# Patient Record
Sex: Female | Born: 1970 | Race: Black or African American | Hispanic: No | Smoking: Never smoker
Health system: Southern US, Community
[De-identification: ages and names within clinical notes are randomized; demographics above are authoritative.]

## PROBLEM LIST (undated history)

## (undated) DIAGNOSIS — B009 Herpesviral infection, unspecified: Secondary | ICD-10-CM

## (undated) DIAGNOSIS — L732 Hidradenitis suppurativa: Secondary | ICD-10-CM

## (undated) DIAGNOSIS — J45909 Unspecified asthma, uncomplicated: Secondary | ICD-10-CM

## (undated) DIAGNOSIS — D649 Anemia, unspecified: Secondary | ICD-10-CM

## (undated) DIAGNOSIS — I10 Essential (primary) hypertension: Secondary | ICD-10-CM

## (undated) DIAGNOSIS — E1165 Type 2 diabetes mellitus with hyperglycemia: Secondary | ICD-10-CM

## (undated) DIAGNOSIS — H269 Unspecified cataract: Secondary | ICD-10-CM

## (undated) DIAGNOSIS — E11319 Type 2 diabetes mellitus with unspecified diabetic retinopathy without macular edema: Secondary | ICD-10-CM

## (undated) DIAGNOSIS — H409 Unspecified glaucoma: Secondary | ICD-10-CM

## (undated) DIAGNOSIS — IMO0002 Reserved for concepts with insufficient information to code with codable children: Secondary | ICD-10-CM

## (undated) DIAGNOSIS — L709 Acne, unspecified: Secondary | ICD-10-CM

## (undated) HISTORY — PX: BREAST CYST EXCISION: SHX579

## (undated) HISTORY — DX: Acne, unspecified: L70.9

## (undated) HISTORY — PX: EYE SURGERY: SHX253

## (undated) HISTORY — DX: Unspecified glaucoma: H40.9

## (undated) HISTORY — DX: Unspecified cataract: H26.9

## (undated) HISTORY — DX: Type 2 diabetes mellitus with unspecified diabetic retinopathy without macular edema: E11.319

## (undated) HISTORY — DX: Hidradenitis suppurativa: L73.2

## (undated) HISTORY — DX: Type 2 diabetes mellitus with hyperglycemia: E11.65

## (undated) HISTORY — DX: Reserved for concepts with insufficient information to code with codable children: IMO0002

---

## 1997-10-07 DIAGNOSIS — O30002 Twin pregnancy, unspecified number of placenta and unspecified number of amniotic sacs, second trimester: Secondary | ICD-10-CM

## 2014-03-02 ENCOUNTER — Ambulatory Visit: Payer: Self-pay | Admitting: Internal Medicine

## 2014-03-02 LAB — CBC CANCER CENTER
BASOS ABS: 2 %
BASOS PCT: 2 %
Bands: 2 %
Basophil #: 0.2 x10 3/mm — ABNORMAL HIGH (ref 0.0–0.1)
EOS PCT: 3 %
Eosinophil #: 0.2 x10 3/mm (ref 0.0–0.7)
Eosinophil %: 2.8 %
HCT: 32.2 % — ABNORMAL LOW (ref 35.0–47.0)
HGB: 9.9 g/dL — ABNORMAL LOW (ref 12.0–16.0)
LYMPHS ABS: 1.3 x10 3/mm (ref 1.0–3.6)
LYMPHS PCT: 15 %
Lymphocyte %: 15.5 %
MCH: 23.8 pg — ABNORMAL LOW (ref 26.0–34.0)
MCHC: 30.8 g/dL — AB (ref 32.0–36.0)
MCV: 77 fL — ABNORMAL LOW (ref 80–100)
MONO ABS: 0.6 x10 3/mm (ref 0.2–0.9)
Monocyte %: 6.8 %
Monocytes: 4 %
NEUTROS ABS: 6 x10 3/mm (ref 1.4–6.5)
Neutrophil %: 72.9 %
Platelet: 595 x10 3/mm — ABNORMAL HIGH (ref 150–440)
RBC: 4.16 10*6/uL (ref 3.80–5.20)
RDW: 29.2 % — ABNORMAL HIGH (ref 11.5–14.5)
SEGMENTED NEUTROPHILS: 74 %
WBC: 8.2 x10 3/mm (ref 3.6–11.0)

## 2014-03-02 LAB — FOLATE: FOLIC ACID: 18.6 ng/mL (ref 3.1–100.0)

## 2014-03-02 LAB — LACTATE DEHYDROGENASE: LDH: 187 U/L (ref 81–246)

## 2014-03-02 LAB — RETICULOCYTES
Absolute Retic Count: 0.0986 10*6/uL (ref 0.019–0.186)
RETICULOCYTE: 2.35 % (ref 0.4–3.1)

## 2014-03-02 LAB — IRON AND TIBC
Iron Bind.Cap.(Total): 310 ug/dL (ref 250–450)
Iron Saturation: 10 %
Iron: 32 ug/dL — ABNORMAL LOW (ref 50–170)
UNBOUND IRON-BIND. CAP.: 278 ug/dL

## 2014-03-02 LAB — FERRITIN: Ferritin (ARMC): 27 ng/mL (ref 8–388)

## 2014-03-06 LAB — PROT IMMUNOELECTROPHORES(ARMC)

## 2014-04-02 ENCOUNTER — Ambulatory Visit: Payer: Self-pay | Admitting: Internal Medicine

## 2014-06-15 ENCOUNTER — Ambulatory Visit: Payer: Self-pay | Admitting: Internal Medicine

## 2014-06-15 LAB — CBC CANCER CENTER
BASOS PCT: 1.7 %
Basophil #: 0.1 x10 3/mm (ref 0.0–0.1)
Eosinophil #: 0.2 x10 3/mm (ref 0.0–0.7)
Eosinophil %: 3.2 %
HCT: 36.9 % (ref 35.0–47.0)
HGB: 11.6 g/dL — ABNORMAL LOW (ref 12.0–16.0)
Lymphocyte #: 1.2 x10 3/mm (ref 1.0–3.6)
Lymphocyte %: 23.3 %
MCH: 27.2 pg (ref 26.0–34.0)
MCHC: 31.4 g/dL — ABNORMAL LOW (ref 32.0–36.0)
MCV: 87 fL (ref 80–100)
MONO ABS: 0.5 x10 3/mm (ref 0.2–0.9)
Monocyte %: 9.5 %
Neutrophil #: 3.3 x10 3/mm (ref 1.4–6.5)
Neutrophil %: 62.3 %
PLATELETS: 317 x10 3/mm (ref 150–440)
RBC: 4.27 10*6/uL (ref 3.80–5.20)
RDW: 13.6 % (ref 11.5–14.5)
WBC: 5.2 x10 3/mm (ref 3.6–11.0)

## 2014-06-15 LAB — IRON AND TIBC
Iron Bind.Cap.(Total): 275 ug/dL (ref 250–450)
Iron Saturation: 33 %
Iron: 91 ug/dL (ref 50–170)
Unbound Iron-Bind.Cap.: 184 ug/dL

## 2014-06-15 LAB — FERRITIN: FERRITIN (ARMC): 23 ng/mL (ref 8–388)

## 2014-07-03 ENCOUNTER — Ambulatory Visit: Payer: Self-pay | Admitting: Internal Medicine

## 2014-08-31 DIAGNOSIS — E11311 Type 2 diabetes mellitus with unspecified diabetic retinopathy with macular edema: Secondary | ICD-10-CM | POA: Insufficient documentation

## 2014-12-27 DIAGNOSIS — B009 Herpesviral infection, unspecified: Secondary | ICD-10-CM | POA: Insufficient documentation

## 2015-07-14 ENCOUNTER — Encounter: Payer: Self-pay | Admitting: Emergency Medicine

## 2015-07-14 ENCOUNTER — Emergency Department
Admission: EM | Admit: 2015-07-14 | Discharge: 2015-07-14 | Disposition: A | Discharge status: Home / Self Care | Payer: Medicaid Other | Attending: Emergency Medicine | Admitting: Emergency Medicine

## 2015-07-14 ENCOUNTER — Emergency Department: Payer: Medicaid Other

## 2015-07-14 DIAGNOSIS — E119 Type 2 diabetes mellitus without complications: Secondary | ICD-10-CM | POA: Insufficient documentation

## 2015-07-14 DIAGNOSIS — J45901 Unspecified asthma with (acute) exacerbation: Secondary | ICD-10-CM | POA: Insufficient documentation

## 2015-07-14 DIAGNOSIS — I10 Essential (primary) hypertension: Secondary | ICD-10-CM | POA: Diagnosis not present

## 2015-07-14 DIAGNOSIS — R601 Generalized edema: Secondary | ICD-10-CM

## 2015-07-14 DIAGNOSIS — R600 Localized edema: Secondary | ICD-10-CM | POA: Diagnosis not present

## 2015-07-14 HISTORY — DX: Essential (primary) hypertension: I10

## 2015-07-14 HISTORY — DX: Herpesviral infection, unspecified: B00.9

## 2015-07-14 HISTORY — DX: Unspecified asthma, uncomplicated: J45.909

## 2015-07-14 HISTORY — DX: Anemia, unspecified: D64.9

## 2015-07-14 LAB — CBC
HEMATOCRIT: 34.3 % — AB (ref 35.0–47.0)
Hemoglobin: 11.3 g/dL — ABNORMAL LOW (ref 12.0–16.0)
MCH: 28.3 pg (ref 26.0–34.0)
MCHC: 32.9 g/dL (ref 32.0–36.0)
MCV: 86 fL (ref 80.0–100.0)
PLATELETS: 321 10*3/uL (ref 150–440)
RBC: 3.99 MIL/uL (ref 3.80–5.20)
RDW: 13.4 % (ref 11.5–14.5)
WBC: 9.9 10*3/uL (ref 3.6–11.0)

## 2015-07-14 LAB — BASIC METABOLIC PANEL
Anion gap: 5 (ref 5–15)
BUN: 23 mg/dL — AB (ref 6–20)
CALCIUM: 8.5 mg/dL — AB (ref 8.9–10.3)
CO2: 29 mmol/L (ref 22–32)
CREATININE: 0.83 mg/dL (ref 0.44–1.00)
Chloride: 103 mmol/L (ref 101–111)
GFR calc Af Amer: >60 mL/min (ref >60)
GLUCOSE: 111 mg/dL — AB (ref 65–99)
Potassium: 4.2 mmol/L (ref 3.5–5.1)
SODIUM: 137 mmol/L (ref 135–145)

## 2015-07-14 LAB — HEPATIC FUNCTION PANEL
ALBUMIN: 2.4 g/dL — AB (ref 3.5–5.0)
ALK PHOS: 143 U/L — AB (ref 38–126)
ALT: 19 U/L (ref 14–54)
AST: 21 U/L (ref 15–41)
BILIRUBIN INDIRECT: 0.5 mg/dL (ref 0.3–0.9)
BILIRUBIN TOTAL: 0.6 mg/dL (ref 0.3–1.2)
Bilirubin, Direct: 0.1 mg/dL (ref 0.1–0.5)
TOTAL PROTEIN: 7 g/dL (ref 6.5–8.1)

## 2015-07-14 LAB — URINALYSIS COMPLETE WITH MICROSCOPIC (ARMC ONLY)
BILIRUBIN URINE: NEGATIVE
Bacteria, UA: NONE SEEN
GLUCOSE, UA: NEGATIVE mg/dL
Ketones, ur: NEGATIVE mg/dL
Leukocytes, UA: NEGATIVE
Nitrite: NEGATIVE
Protein, ur: >500 mg/dL — AB
SPECIFIC GRAVITY, URINE: 1.015 (ref 1.005–1.030)
pH: 6 (ref 5.0–8.0)

## 2015-07-14 LAB — TROPONIN I: Troponin I: <0.03 ng/mL (ref <0.031)

## 2015-07-14 LAB — GLUCOSE, CAPILLARY: GLUCOSE-CAPILLARY: 118 mg/dL — AB (ref 65–99)

## 2015-07-14 MED ORDER — FUROSEMIDE 40 MG PO TABS
40.0000 mg | ORAL_TABLET | Freq: Once | ORAL | Status: AC
  Administered 2015-07-14: 40 mg via ORAL
  Filled 2015-07-14: qty 1

## 2015-07-14 NOTE — ED Notes (Signed)
C/O generalized edema for just a little over a week and elevated blood pressure for the past couple of days.  Patient taking HCTZ and Lisinopril.  Seen by PCP on Thursday for same complaint, given lasix 20 mg to take.  Patient referred to Cardiology for this week and expects a call from Cardiology this week to set up a follow up appointment.  Patient has taken for the past three days.  Blood pressure was 185/101 this morning.

## 2015-07-14 NOTE — Discharge Instructions (Signed)
Begin taking 40 mg total of your Lasix per day (increase to two pills). Edema Edema is an abnormal buildup of fluids. It is more common in your legs and thighs. Painless swelling of the feet and ankles is more likely as a person ages. It also is common in looser skin, like around your eyes. HOME CARE   Keep the affected body part above the level of the heart while lying down.  Do not sit still or stand for a long time.  Do not put anything right under your knees when you lie down.  Do not wear tight clothes on your upper legs.  Exercise your legs to help the puffiness (swelling) go down.  Wear elastic bandages or support stockings as told by your doctor.  A low-salt diet may help lessen the puffiness.  Only take medicine as told by your doctor. GET HELP IF:  Treatment is not working.  You have heart, liver, or kidney disease and notice that your skin looks puffy or shiny.  You have puffiness in your legs that does not get better when you raise your legs.  You have sudden weight gain for no reason. GET HELP RIGHT AWAY IF:   You have shortness of breath or chest pain.  You cannot breathe when you lie down.  You have pain, redness, or warmth in the areas that are puffy.  You have heart, liver, or kidney disease and get edema all of a sudden.  You have a fever and your symptoms get worse all of a sudden. MAKE SURE YOU:   Understand these instructions.  Will watch your condition.  Will get help right away if you are not doing well or get worse.   This information is not intended to replace advice given to you by your health care provider. Make sure you discuss any questions you have with your health care provider.   Document Released: 11/05/2007 Document Revised: 05/24/2013 Document Reviewed: 03/11/2013 Elsevier Interactive Patient Education 2016 [employer].  Hypertension Hypertension, commonly called high blood pressure, is when the force of blood pumping through  your arteries is too strong. Your arteries are the blood vessels that carry blood from your heart throughout your body. A blood pressure reading consists of a higher number over a lower number, such as 110/72. The higher number (systolic) is the pressure inside your arteries when your heart pumps. The lower number (diastolic) is the pressure inside your arteries when your heart relaxes. Ideally you want your blood pressure below 120/80. Hypertension forces your heart to work harder to pump blood. Your arteries may become narrow or stiff. Having untreated or uncontrolled hypertension can cause heart attack, stroke, kidney disease, and other problems. RISK FACTORS Some risk factors for high blood pressure are controllable. Others are not.  Risk factors you cannot control include:   Race. You may be at higher risk if you are African American.  Age. Risk increases with age.  Gender. Men are at higher risk than women before age 3 years. After age 59, women are at higher risk than men. Risk factors you can control include:  Not getting enough exercise or physical activity.  Being overweight.  Getting too much fat, sugar, calories, or salt in your diet.  Drinking too much alcohol. SIGNS AND SYMPTOMS Hypertension does not usually cause signs or symptoms. Extremely high blood pressure (hypertensive crisis) may cause headache, anxiety, shortness of breath, and nosebleed. DIAGNOSIS To check if you have hypertension, your health care provider will measure  your blood pressure while you are seated, with your arm held at the level of your heart. It should be measured at least twice using the same arm. Certain conditions can cause a difference in blood pressure between your right and left arms. A blood pressure reading that is higher than normal on one occasion does not mean that you need treatment. If it is not clear whether you have high blood pressure, you may be asked to return on a different day to have  your blood pressure checked again. Or, you may be asked to monitor your blood pressure at home for 1 or more weeks. TREATMENT Treating high blood pressure includes making lifestyle changes and possibly taking medicine. Living a healthy lifestyle can help lower high blood pressure. You may need to change some of your habits. Lifestyle changes may include:  Following the DASH diet. This diet is high in fruits, vegetables, and whole grains. It is low in salt, red meat, and added sugars.  Keep your sodium intake below 2,300 mg per day.  Getting at least 30-45 minutes of aerobic exercise at least 4 times per week.  Losing weight if necessary.  Not smoking.  Limiting alcoholic beverages.  Learning ways to reduce stress. Your health care provider may prescribe medicine if lifestyle changes are not enough to get your blood pressure under control, and if one of the following is true:  You are 78-56 years of age and your systolic blood pressure is above 140.  You are 53 years of age or older, and your systolic blood pressure is above 150.  Your diastolic blood pressure is above 90.  You have diabetes, and your systolic blood pressure is over 140 or your diastolic blood pressure is over 90.  You have kidney disease and your blood pressure is above 140/90.  You have heart disease and your blood pressure is above 140/90. Your personal target blood pressure may vary depending on your medical conditions, your age, and other factors. HOME CARE INSTRUCTIONS  Have your blood pressure rechecked as directed by your health care provider.   Take medicines only as directed by your health care provider. Follow the directions carefully. Blood pressure medicines must be taken as prescribed. The medicine does not work as well when you skip doses. Skipping doses also puts you at risk for problems.  Do not smoke.   Monitor your blood pressure at home as directed by your health care provider. SEEK  MEDICAL CARE IF:   You think you are having a reaction to medicines taken.  You have recurrent headaches or feel dizzy.  You have swelling in your ankles.  You have trouble with your vision. SEEK IMMEDIATE MEDICAL CARE IF:  You develop a severe headache or confusion.  You have unusual weakness, numbness, or feel faint.  You have severe chest or abdominal pain.  You vomit repeatedly.  You have trouble breathing. MAKE SURE YOU:   Understand these instructions.  Will watch your condition.  Will get help right away if you are not doing well or get worse.   This information is not intended to replace advice given to you by your health care provider. Make sure you discuss any questions you have with your health care provider.   Document Released: 05/19/2005 Document Revised: 10/03/2014 Document Reviewed: 03/11/2013 Elsevier Interactive Patient Education [employer]

## 2015-07-14 NOTE — ED Provider Notes (Addendum)
[facility] Emergency Department Provider Note  ____________________________________________  Time seen: Approximately 145 P she says that M  I have reviewed the triage vital signs and the nursing notes.   HISTORY  Chief Complaint Edema    HPI Cassandra Black is a 45 y.o. female is a 45 year old female with a history of diabetes and hypertension and peripheral edema with presenting today with 2 weeks of worsening peripheral edema. She says that both her feet ankles and legs have been swollen over the past 2 weeks and despite following up with her primary care doctor and having been started on Lasix this past Thursday she has persistent swelling. She says that she took her third dose of 20 mg per day Lasix this morning. She says that she does feel a tight pain to her lower extremities that she thinks is from the edema.She says that she also feels that she is wheezing in the evenings. Denies any chest pain. His posterior following up with a cardiologist this coming week. Has been seen at the [facility].   Past Medical History  Diagnosis Date  . Diabetes mellitus without complication (HCC)   . Hypertension   . Asthma   . HSV infection   . Anemia     There are no active problems to display for this patient.   History reviewed. No pertinent past surgical history.  No current outpatient prescriptions on file.  Allergies Review of patient's allergies indicates no known allergies.  No family history on file.  Social History Social History  Substance Use Topics  . Smoking status: Never Smoker   . Smokeless tobacco: Never Used  . Alcohol Use: Yes     Comment: occasionally    Review of Systems Constitutional: No fever/chills Eyes: No visual changes. ENT: No sore throat. Cardiovascular: Denies chest pain. Respiratory: As above Gastrointestinal: No abdominal pain.  No nausea, no vomiting.  No diarrhea.  No constipation. Genitourinary: Negative for  dysuria. Musculoskeletal: Negative for back pain. Skin: Negative for rash. Neurological: Negative for headaches, focal weakness or numbness.  10-point ROS otherwise negative.  ____________________________________________   PHYSICAL EXAM:  VITAL SIGNS: ED Triage Vitals  Enc Vitals Group     BP 07/14/15 1138 167/97 mmHg     Pulse Rate 07/14/15 1138 99     Resp 07/14/15 1138 18     Temp 07/14/15 1138 98.2 F (36.8 C)     Temp Source 07/14/15 1138 Oral     SpO2 07/14/15 1138 100 %     Weight 07/14/15 1138 237 lb (107.502 kg)     Height 07/14/15 1138  (1.651 m)     Head Cir --      Peak Flow --      Pain Score 07/14/15 1138 6     Pain Loc --      Pain Edu? --      Excl. in GC? --     Constitutional: Alert and oriented. Well appearing and in no acute distress. Eyes: Conjunctivae are normal. PERRL. EOMI. Head: Atraumatic. Nose: No congestion/rhinnorhea. Mouth/Throat: Mucous membranes are moist.  Oropharynx non-erythematous. Neck: No stridor.   Cardiovascular: Normal rate, regular rhythm. Grossly normal heart sounds.  Good peripheral circulation. Respiratory: Normal respiratory effort.  No retractions. Lungs CTAB. Gastrointestinal: Soft and nontender. No distention. No abdominal bruits. No CVA tenderness. Musculoskeletal: Bilateral lower extremity edema to the feet and ankles as well as calves without tenderness, induration or erythema. Bilateral and equal dorsalis pedis pulses are  palpated. Neurologic:  Normal speech and language. No gross focal neurologic deficits are appreciated. No gait instability. Skin:  Skin is warm, dry and intact. No rash noted. Psychiatric: Mood and affect are normal. Speech and behavior are normal.  ____________________________________________   LABS (all labs ordered are listed, but only abnormal results are displayed)  Labs Reviewed  GLUCOSE, CAPILLARY - Abnormal; Notable for the following:    Glucose-Capillary 118 (*)    All other  components within normal limits  BASIC METABOLIC PANEL - Abnormal; Notable for the following:    Glucose, Bld 111 (*)    BUN 23 (*)    Calcium 8.5 (*)    All other components within normal limits  CBC - Abnormal; Notable for the following:    Hemoglobin 11.3 (*)    HCT 34.3 (*)    All other components within normal limits  URINALYSIS COMPLETEWITH MICROSCOPIC ([facility] ONLY) - Abnormal; Notable for the following:    Color, Urine YELLOW (*)    APPearance CLEAR (*)    Hgb urine dipstick 1+ (*)    Protein, ur >500 (*)    Squamous Epithelial / LPF 0-5 (*)    All other components within normal limits  HEPATIC FUNCTION PANEL - Abnormal; Notable for the following:    Albumin 2.4 (*)    Alkaline Phosphatase 143 (*)    All other components within normal limits  TROPONIN I   ____________________________________________  EKG  ED ECG REPORT I, Arelia Longest, the attending physician, personally viewed and interpreted this ECG.   Date: 07/14/2015  EKG Time: 1217  Rate: 100  Rhythm: normal sinus rhythm  Axis: Normal axis  Intervals:none  ST&T Change: No ST segment elevation or depression. No abnormal T-wave inversion.  ____________________________________________  RADIOLOGY  Mild peribronchial thickening of uncertain chronicity. Possible trace pleural effusions. ____________________________________________   PROCEDURES  ____________________________________________   INITIAL IMPRESSION / ASSESSMENT AND PLAN / ED COURSE  Pertinent labs & imaging results that were available during my care of the patient were reviewed by me and considered in my medical decision making (see chart for details).  ----------------------------------------- 2:09 PM on 07/14/2015 -----------------------------------------  Discussed with cardiology, Dr.Fath, plan of action and agrees with increasing Lasix to 40 mg daily. Discussed combining with HCTZ.  Unclear etiology of the edema but appears to  be third spacing throughout. We'll need to follow-up with cardiology in office. We'll give number for [facility] on the chart. Already has been working to follow up with cardiology this week through her primary care doctor.  ----------------------------------------- 2:56 PM on 07/14/2015 -----------------------------------------  Patient is resting comfortably at this time. Patient knows to now begin taking 2 of her 20 mg Lasix every morning. She'll follow-up with cardiology. Will be discharged to home. ____________________________________________   FINAL CLINICAL IMPRESSION(S) / ED DIAGNOSES  Uncontrolled hypertension. Edema.    Myrna Blazer, MD 07/14/15 1456  Patient is premenopausal but says that she has an IUD as well as her tubes tied. Highly unlikely that she would be pregnant at this time.  Myrna Blazer, MD 07/14/15 1456  Heart rate of 99 on reexam. Patient also says that she had been noncompliant for several days prior to this most recent episode of edema beginning. Possibility that her noncompliance and triggered the edema this time around.  Myrna Blazer, MD 07/14/15 [phone]

## 2015-07-17 DIAGNOSIS — I5021 Acute systolic (congestive) heart failure: Secondary | ICD-10-CM | POA: Insufficient documentation

## 2015-07-17 DIAGNOSIS — E782 Mixed hyperlipidemia: Secondary | ICD-10-CM | POA: Insufficient documentation

## 2015-07-21 ENCOUNTER — Emergency Department
Admission: EM | Admit: 2015-07-21 | Discharge: 2015-07-21 | Disposition: A | Discharge status: Home / Self Care | Payer: Medicaid Other | Attending: Emergency Medicine | Admitting: Emergency Medicine

## 2015-07-21 ENCOUNTER — Encounter: Payer: Self-pay | Admitting: Emergency Medicine

## 2015-07-21 ENCOUNTER — Emergency Department: Payer: Medicaid Other

## 2015-07-21 DIAGNOSIS — R079 Chest pain, unspecified: Secondary | ICD-10-CM | POA: Diagnosis present

## 2015-07-21 DIAGNOSIS — E119 Type 2 diabetes mellitus without complications: Secondary | ICD-10-CM | POA: Diagnosis not present

## 2015-07-21 DIAGNOSIS — I10 Essential (primary) hypertension: Secondary | ICD-10-CM | POA: Insufficient documentation

## 2015-07-21 DIAGNOSIS — R600 Localized edema: Secondary | ICD-10-CM | POA: Diagnosis not present

## 2015-07-21 DIAGNOSIS — R609 Edema, unspecified: Secondary | ICD-10-CM

## 2015-07-21 LAB — BASIC METABOLIC PANEL
ANION GAP: 6 (ref 5–15)
BUN: 24 mg/dL — ABNORMAL HIGH (ref 6–20)
CALCIUM: 8.7 mg/dL — AB (ref 8.9–10.3)
CO2: 28 mmol/L (ref 22–32)
CREATININE: 0.75 mg/dL (ref 0.44–1.00)
Chloride: 103 mmol/L (ref 101–111)
GFR calc Af Amer: >60 mL/min (ref >60)
Glucose, Bld: 225 mg/dL — ABNORMAL HIGH (ref 65–99)
Potassium: 3.8 mmol/L (ref 3.5–5.1)
SODIUM: 137 mmol/L (ref 135–145)

## 2015-07-21 LAB — FIBRIN DERIVATIVES D-DIMER (ARMC ONLY): FIBRIN DERIVATIVES D-DIMER (ARMC): 5918 — AB (ref 0–499)

## 2015-07-21 MED ORDER — SODIUM CHLORIDE 0.9 % IV BOLUS (SEPSIS)
1000.0000 mL | Freq: Once | INTRAVENOUS | Status: AC
  Administered 2015-07-21: 1000 mL via INTRAVENOUS

## 2015-07-21 MED ORDER — IOHEXOL 350 MG/ML SOLN
125.0000 mL | Freq: Once | INTRAVENOUS | Status: AC | PRN
  Administered 2015-07-21: 125 mL via INTRAVENOUS

## 2015-07-21 MED ORDER — IOHEXOL 240 MG/ML SOLN
25.0000 mL | Freq: Once | INTRAMUSCULAR | Status: AC | PRN
  Administered 2015-07-21: 25 mL via ORAL

## 2015-07-21 NOTE — Discharge Instructions (Signed)
Your CT scan shows some lesions in the liver and spleen. Please follow-up with hematology regarding this finding. CT report summarized below.  No evidence of pulmonary embolism.  Subtle patchy hazy reticulonodular opacification over the right lung which may be due to right atypical infectious or inflammatory process. Small amount of bilateral pleural fluid and right basilar atelectasis. Consider follow-up chest CT 4-6 weeks.  Possible mild hepatic splenomegaly with multiple small ill-defined hypodensities within the liver and spleen more prominent within the spleen. No significant adenopathy other than mild prominence of bilateral superficial inguinal nodes. Findings may be seen in malignancy such as lymphoma/leukemia in addition to metastatic disease. Infectious processes such as candidiasis may also be considered in an immunocompromised patient. Granulomatous disease such as TB/ sarcoidosis is less likely.  Mild diffuse subcutaneous edema.  IUD over the lower endometrial canal  Nonspecific Chest Pain  Chest pain can be caused by many different conditions. There is always a chance that your pain could be related to something serious, such as a heart attack or a blood clot in your lungs. Chest pain can also be caused by conditions that are not life-threatening. If you have chest pain, it is very important to follow up with your health care provider. CAUSES  Chest pain can be caused by:  Heartburn.  Pneumonia or bronchitis.  Anxiety or stress.  Inflammation around your heart (pericarditis) or lung (pleuritis or pleurisy).  A blood clot in your lung.  A collapsed lung (pneumothorax). It can develop suddenly on its own (spontaneous pneumothorax) or from trauma to the chest.  Shingles infection (varicella-zoster virus).  Heart attack.  Damage to the bones, muscles, and cartilage that make up your chest wall. This can include:  Bruised bones due to injury.  Strained  muscles or cartilage due to frequent or repeated coughing or overwork.  Fracture to one or more ribs.  Sore cartilage due to inflammation (costochondritis). RISK FACTORS  Risk factors for chest pain may include:  Activities that increase your risk for trauma or injury to your chest.  Respiratory infections or conditions that cause frequent coughing.  Medical conditions or overeating that can cause heartburn.  Heart disease or family history of heart disease.  Conditions or health behaviors that increase your risk of developing a blood clot.  Having had chicken pox (varicella zoster). SIGNS AND SYMPTOMS Chest pain can feel like:  Burning or tingling on the surface of your chest or deep in your chest.  Crushing, pressure, aching, or squeezing pain.  Dull or sharp pain that is worse when you move, cough, or take a deep breath.  Pain that is also felt in your back, neck, shoulder, or arm, or pain that spreads to any of these areas. Your chest pain may come and go, or it may stay constant. DIAGNOSIS Lab tests or other studies may be needed to find the cause of your pain. Your health care provider may have you take a test called an ambulatory ECG (electrocardiogram). An ECG records your heartbeat patterns at the time the test is performed. You may also have other tests, such as:  Transthoracic echocardiogram (TTE). During echocardiography, sound waves are used to create a picture of all of the heart structures and to look at how blood flows through your heart.  Transesophageal echocardiogram (TEE).This is a more advanced imaging test that obtains images from inside your body. It allows your health care provider to see your heart in finer detail.  Cardiac monitoring. This allows your health  care provider to monitor your heart rate and rhythm in real time.  Holter monitor. This is a portable device that records your heartbeat and can help to diagnose abnormal heartbeats. It allows  your health care provider to track your heart activity for several days, if needed.  Stress tests. These can be done through exercise or by taking medicine that makes your heart beat more quickly.  Blood tests.  Imaging tests. TREATMENT  Your treatment depends on what is causing your chest pain. Treatment may include:  Medicines. These may include:  Acid blockers for heartburn.  Anti-inflammatory medicine.  Pain medicine for inflammatory conditions.  Antibiotic medicine, if an infection is present.  Medicines to dissolve blood clots.  Medicines to treat coronary artery disease.  Supportive care for conditions that do not require medicines. This may include:  Resting.  Applying heat or cold packs to injured areas.  Limiting activities until pain decreases. HOME CARE INSTRUCTIONS  If you were prescribed an antibiotic medicine, finish it all even if you start to feel better.  Avoid any activities that bring on chest pain.  Do not use any tobacco products, including cigarettes, chewing tobacco, or electronic cigarettes. If you need help quitting, ask your health care provider.  Do not drink alcohol.  Take medicines only as directed by your health care provider.  Keep all follow-up visits as directed by your health care provider. This is important. This includes any further testing if your chest pain does not go away.  If heartburn is the cause for your chest pain, you may be told to keep your head raised (elevated) while sleeping. This reduces the chance that acid will go from your stomach into your esophagus.  Make lifestyle changes as directed by your health care provider. These may include:  Getting regular exercise. Ask your health care provider to suggest some activities that are safe for you.  Eating a heart-healthy diet. A registered dietitian can help you to learn healthy eating options.  Maintaining a healthy weight.  Managing diabetes, if  necessary.  Reducing stress. SEEK MEDICAL CARE IF:  Your chest pain does not go away after treatment.  You have a rash with blisters on your chest.  You have a fever. SEEK IMMEDIATE MEDICAL CARE IF:   Your chest pain is worse.  You have an increasing cough, or you cough up blood.  You have severe abdominal pain.  You have severe weakness.  You faint.  You have chills.  You have sudden, unexplained chest discomfort.  You have sudden, unexplained discomfort in your arms, back, neck, or jaw.  You have shortness of breath at any time.  You suddenly start to sweat, or your skin gets clammy.  You feel nauseous or you vomit.  You suddenly feel light-headed or dizzy.  Your heart begins to beat quickly, or it feels like it is skipping beats. These symptoms may represent a serious problem that is an emergency. Do not wait to see if the symptoms will go away. Get medical help right away. Call your local emergency services (911 in the [place] Do not drive yourself to the hospital.   This information is not intended to replace advice given to you by your health care provider. Make sure you discuss any questions you have with your health care provider.   Document Released: 02/26/2005 Document Revised: 06/09/2014 Document Reviewed: 12/23/2013 Elsevier Interactive Patient Education [employer]

## 2015-07-21 NOTE — ED Provider Notes (Signed)
[facility] Emergency Department Provider Note  ____________________________________________  Time seen: 4:00 PM  I have reviewed the triage vital signs and the nursing notes.   HISTORY  Chief Complaint Mass    HPI Cassandra Black is a 45 y.o. female who complains of feeling a mass on her left side at the lateral inferior chest wall. Denies any trauma. No shortness of breath but has had some vague chest discomfort over the last 2 days has been constant. She has peripheral edema as well and has been titrated up on Lasix, is noted that with improvement of her peripheral edema the left leg is remaining more swollen than the right.Chest pain is not exertional, not pleuritic. It is dull aching nonradiating.     Past Medical History  Diagnosis Date  . Diabetes mellitus without complication (HCC)   . Hypertension   . Asthma   . HSV infection   . Anemia      There are no active problems to display for this patient.    No past surgical history on file.   No current outpatient prescriptions on file.   Allergies Review of patient's allergies indicates no known allergies.   No family history on file. Cancer Social History Social History  Substance Use Topics  . Smoking status: Never Smoker   . Smokeless tobacco: Never Used  . Alcohol Use: Yes     Comment: occasionally    Review of Systems  Constitutional:   No fever or chills. No weight changes Eyes:   No blurry vision or double vision.  ENT:   No sore throat.  Cardiovascular:   Positive chest pain. Respiratory:   No dyspnea or cough. Gastrointestinal:   Negative for abdominal pain, vomiting and diarrhea.  No BRBPR or melena. Genitourinary:   Negative for dysuria or difficulty urinating. Musculoskeletal:   Negative for back pain. Bilateral lower extremity edema. Skin:   Negative for rash. Neurological:   Negative for headaches, focal weakness or numbness. Psychiatric:  No anxiety or  depression.   Endocrine:  No changes in energy or sleep difficulty.  10-point ROS otherwise negative.  ____________________________________________   PHYSICAL EXAM:  VITAL SIGNS: ED Triage Vitals  Enc Vitals Group     BP 07/21/15 1447 147/85 mmHg     Pulse Rate 07/21/15 1447 98     Resp 07/21/15 1447 18     Temp 07/21/15 1447 98.6 F (37 C)     Temp Source 07/21/15 1447 Oral     SpO2 07/21/15 1447 95 %     Weight 07/21/15 1447 230 lb (104.327 kg)     Height 07/21/15 1447  (1.651 m)     Head Cir --      Peak Flow --      Pain Score 07/21/15 1448 0     Pain Loc --      Pain Edu? --      Excl. in GC? --     Vital signs reviewed, nursing assessments reviewed.   Constitutional:   Alert and oriented. Well appearing and in no distress. Eyes:   No scleral icterus. No conjunctival pallor. PERRL. EOMI ENT   Head:   Normocephalic and atraumatic.   Nose:   No congestion/rhinnorhea. No septal hematoma   Mouth/Throat:   MMM, no pharyngeal erythema. No peritonsillar mass.    Neck:   No stridor. No SubQ emphysema. No meningismus. Hematological/Lymphatic/Immunilogical:   No cervical lymphadenopathy. Cardiovascular:   RRR. Symmetric bilateral radial and DP  pulses.  No murmurs.  Respiratory:   Normal respiratory effort without tachypnea nor retractions. Breath sounds are clear and equal bilaterally. No wheezes/rales/rhonchi. Gastrointestinal:   Soft and nontender. Non distended. There is no CVA tenderness.  No rebound, rigidity, or guarding. No organomegaly Genitourinary:   deferred Musculoskeletal:   Nontender with normal range of motion in all extremities. No joint effusions.  No lower extremity tenderness.  No edema. Chest wall stable and nontender Neurologic:   Normal speech and language.  CN 2-10 normal. Motor grossly intact. No gross focal neurologic deficits are appreciated.  Skin:    Skin is warm, dry and intact. No rash noted.  No petechiae, purpura, or  bullae. Psychiatric:   Mood and affect are normal. No SI or hallucinations ____________________________________________    LABS (pertinent positives/negatives) (all labs ordered are listed, but only abnormal results are displayed) Labs Reviewed  BASIC METABOLIC PANEL - Abnormal; Notable for the following:    Glucose, Bld 225 (*)    BUN 24 (*)    Calcium 8.7 (*)    All other components within normal limits  FIBRIN DERIVATIVES D-DIMER ([facility] ONLY) - Abnormal; Notable for the following:    Fibrin derivatives D-dimer [facility]) 5918 (*)    All other components within normal limits  POC URINE PREG, ED   ____________________________________________   EKG    ____________________________________________    RADIOLOGY  Chest x-ray unremarkable CT angiogram chest negative for PE. CT abdomen and pelvis shows many lesions of the liver and spleen concerning for hematologic malignancy  ____________________________________________   PROCEDURES   ____________________________________________   INITIAL IMPRESSION / ASSESSMENT AND PLAN / ED COURSE  Pertinent labs & imaging results that were available during my care of the patient were reviewed by me and considered in my medical decision making (see chart for details).  Patient presents with bilateral peripheral edema left greater than right. Initially did ultrasounds of bilateral legs which are negative for DVT. Also sent a d-dimer due to likely technical difficulties with the scan because of the edema. D-dimer came back very elevated at 6000. Proceeded with CT angiogram which was negative but did raise concern for hematologic malignancy. Patient informed of these findings and will follow-up with hematology.     ____________________________________________   FINAL CLINICAL IMPRESSION(S) / ED DIAGNOSES  Final diagnoses:  Nonspecific chest pain      Sharman Cheek, MD 07/21/15 2129

## 2015-07-21 NOTE — ED Notes (Signed)
Patient states that last night she noticed that she had a "knot" on her left side. Patient states that she was lying on her left side and felt the knot on her right side, patient states that she could move the knot with her hand.   Patient also states that she has had cough for the past 4 weeks. Patient also has bilateral lower extremity edema, has been getting better over the past few days with increased Lasix dose.

## 2015-07-21 NOTE — ED Notes (Signed)
Will follow up Dr. Donneta Romberg on Monday.

## 2015-07-21 NOTE — ED Notes (Signed)
States feels nodule L side, denies being painful, wished to have it checked due to history "fluid on lungs" recently

## 2015-07-23 ENCOUNTER — Other Ambulatory Visit: Payer: Self-pay | Admitting: Internal Medicine

## 2015-07-25 ENCOUNTER — Inpatient Hospital Stay: Payer: Medicaid Other | Attending: Oncology | Admitting: Oncology

## 2015-07-25 ENCOUNTER — Inpatient Hospital Stay: Payer: Medicaid Other

## 2015-07-25 VITALS — BP 151/95 | HR 92 | Temp 98.2°F | Ht 66.4 in | Wt 209.8 lb

## 2015-07-25 DIAGNOSIS — R599 Enlarged lymph nodes, unspecified: Secondary | ICD-10-CM | POA: Insufficient documentation

## 2015-07-25 DIAGNOSIS — R601 Generalized edema: Secondary | ICD-10-CM | POA: Insufficient documentation

## 2015-07-25 DIAGNOSIS — I10 Essential (primary) hypertension: Secondary | ICD-10-CM | POA: Diagnosis not present

## 2015-07-25 DIAGNOSIS — J45909 Unspecified asthma, uncomplicated: Secondary | ICD-10-CM | POA: Diagnosis not present

## 2015-07-25 DIAGNOSIS — D7389 Other diseases of spleen: Secondary | ICD-10-CM | POA: Diagnosis present

## 2015-07-25 DIAGNOSIS — E119 Type 2 diabetes mellitus without complications: Secondary | ICD-10-CM | POA: Insufficient documentation

## 2015-07-25 DIAGNOSIS — Z79899 Other long term (current) drug therapy: Secondary | ICD-10-CM | POA: Insufficient documentation

## 2015-07-25 DIAGNOSIS — R9389 Abnormal findings on diagnostic imaging of other specified body structures: Secondary | ICD-10-CM

## 2015-07-25 NOTE — Progress Notes (Signed)
Patient brought to exam room 8, ambulates without assistance.  Patient denies pain or discomfort, vitals stable and documented.  Medication Rec updated, information provided by patient.

## 2015-07-27 ENCOUNTER — Encounter: Payer: Self-pay | Admitting: *Deleted

## 2015-07-27 NOTE — Progress Notes (Signed)
Patient has appointment with Dr. Evette Cristal for consultation on Wednesday 3/1 @ 4:00pm. Patient needs to arrive at 3:30 pm.

## 2015-07-29 NOTE — Progress Notes (Signed)
[facility]  Telephone:([phone]) [phone] [phone]) [phone]  ID: Doran Stabler OB: [birth year]  MR#: [id number]  QIO#:[id number]  Patient Care Team: Ricardo Jericho, NP as PCP - General (Family Medicine)  CHIEF COMPLAINT:  Chief Complaint  Patient presents with  . New Patient (Initial Visit)    INTERVAL HISTORY: Patient is a 45 year old female who presented to the emergency room several weeks ago with total body anasarca. No obvious etiology could be determined and patient was instructed to increase her Lasix dose and follow-up with cardiology. She will return to the ER 1 week later with left flank pain. Subsequent workup included CT scan which revealed multiple lesions in her spleen and liver concerning for underlying lymphoma. She otherwise feels well. She denies any fevers, night sweats, or weight loss. She has no pain. She denies any neurologic complaints. She has no further chest pain and denies shortness of breath. She denies any abdominal pain. She has no nausea, vomiting, constipation, or diarrhea. She has no urinary complaints. Patient otherwise feels well and offers no further specific complaints.  REVIEW OF SYSTEMS:   Review of Systems  Constitutional: Negative.  Negative for fever, weight loss and malaise/fatigue.  Respiratory: Negative.  Negative for cough and shortness of breath.   Cardiovascular: Positive for leg swelling. Negative for chest pain.  Gastrointestinal: Negative.  Negative for abdominal pain.  Genitourinary: Negative.   Musculoskeletal: Negative.   Neurological: Negative.  Negative for weakness.    As per HPI. Otherwise, a complete review of systems is negatve.  PAST MEDICAL HISTORY: Past Medical History  Diagnosis Date  . Diabetes mellitus without complication ([place])   . Hypertension   . Asthma   . HSV infection   . Anemia     PAST SURGICAL HISTORY: No past surgical history on file.  FAMILY HISTORY: Reviewed and unchanged. No  reported history of malignancy or chronic disease.     ADVANCED DIRECTIVES:    HEALTH MAINTENANCE: Social History  Substance Use Topics  . Smoking status: Never Smoker   . Smokeless tobacco: Never Used  . Alcohol Use: Yes     Comment: occasionally     Colonoscopy:  PAP:  Bone density:  Lipid panel:  No Known Allergies  Current Outpatient Prescriptions  Medication Sig Dispense Refill  . albuterol (PROAIR HFA) 108 (90 Base) MCG/ACT inhaler Inhale 90 mcg into the lungs as needed.    Marland Kitchen atorvastatin (LIPITOR) 40 MG tablet Take 40 mg by mouth daily.    . carvedilol (COREG) 3.125 MG tablet Take 3.125 mg by mouth 2 (two) times daily.    . furosemide (LASIX) 40 MG tablet Take 40 mg by mouth daily.    . insulin aspart (NOVOLOG) 100 UNIT/ML FlexPen Inject 22 Units into the skin 3 (three) times daily.    . Insulin Glargine (LANTUS SOLOSTAR) 100 UNIT/ML Solostar Pen Inject 45 Units into the skin Nightly.    . Lancets 28G MISC     . lisinopril (PRINIVIL,ZESTRIL) 5 MG tablet Take 5 mg by mouth daily.    . metFORMIN (GLUCOPHAGE-XR) 500 MG 24 hr tablet Take 500 mg by mouth daily.    . mometasone (ASMANEX 60 METERED DOSES) 220 MCG/INH inhaler Inhale into the lungs.    . valACYclovir (VALTREX) 500 MG tablet Take 500 mg by mouth daily.     No current facility-administered medications for this visit.    OBJECTIVE: Filed Vitals:   07/25/15 1358  BP: 151/95  Pulse: 92  Temp: 98.2 F (36.8  C)     Body mass index is 33.43 kg/(m^2).    ECOG FS:0 - Asymptomatic  General: Well-developed, well-nourished, no acute distress. Eyes: Pink conjunctiva, anicteric sclera. HEENT: Normocephalic, moist mucous membranes, clear oropharnyx. Lungs: Clear to auscultation bilaterally. Heart: Regular rate and rhythm. No rubs, murmurs, or gallops. Abdomen: Soft, nontender, nondistended. No organomegaly noted, normoactive bowel sounds. Musculoskeletal: 2-3+ lower extremity edema. Neuro: Alert, answering all  questions appropriately. Cranial nerves grossly intact. Skin: No rashes or petechiae noted. Psych: Normal affect. Lymphatics: No cervical, calvicular, axillary or inguinal LAD.   LAB RESULTS:  Lab Results  Component Value Date   NA 137 07/21/2015   K 3.8 07/21/2015   CL 103 07/21/2015   CO2 28 07/21/2015   GLUCOSE 225* 07/21/2015   BUN 24* 07/21/2015   CREATININE 0.75 07/21/2015   CALCIUM 8.7* 07/21/2015   PROT 7.0 07/14/2015   ALBUMIN 2.4* 07/14/2015   AST 21 07/14/2015   ALT 19 07/14/2015   ALKPHOS 143* 07/14/2015   BILITOT 0.6 07/14/2015   GFRNONAA >60 07/21/2015   GFRAA >60 07/21/2015    Lab Results  Component Value Date   WBC 9.9 07/14/2015   NEUTROABS 3.3 06/15/2014   HGB 11.3* 07/14/2015   HCT 34.3* 07/14/2015   MCV 86.0 07/14/2015   PLT 321 07/14/2015     STUDIES: Dg Chest 2 View  07/21/2015  CLINICAL DATA:  Cough.  Left-sided chest pain for 1 day. EXAM: CHEST  2 VIEW COMPARISON:  07/14/2015 FINDINGS: The cardiomediastinal contours are normal. Mild peribronchial thickening is similar to prior exam. Suspect small bilateral pleural effusions, also similar. Pulmonary vasculature is normal. No consolidation or pneumothorax. No acute osseous abnormalities are seen. IMPRESSION: 1. No peribronchial thickening, unchanged from prior exam. No focal consolidation. 2. Probable small pleural effusions, unchanged from prior. Electronically Signed   By: Jeb Levering M.D.   On: 07/21/2015 16:08   Dg Chest 2 View  07/14/2015  CLINICAL DATA:  45 year old female with cough and congestion for 4 weeks. History of asthma. EXAM: CHEST  2 VIEW COMPARISON:  None. FINDINGS: The cardiomediastinal silhouette is unremarkable. Mild peribronchial thickening is noted. Minimal blunting of the costophrenic angles could represent trace effusions. There is no evidence of focal airspace disease, pulmonary edema, suspicious pulmonary nodule/mass, or pneumothorax. No acute bony abnormalities are  identified. IMPRESSION: Mild peribronchial thickening of uncertain chronicity. No evidence of focal pneumonia. Possible trace pleural effusions. Electronically Signed   By: Margarette Canada M.D.   On: 07/14/2015 13:08   Ct Angio Chest Pe W/cm &/or Wo Cm  07/21/2015  CLINICAL DATA:  Cough 4 weeks. Bilateral lower extremity edema. Patient feels lump over left side as well as right side. EXAM: CT ANGIOGRAPHY CHEST CT ABDOMEN AND PELVIS WITH CONTRAST TECHNIQUE: Multidetector CT imaging of the chest was performed using the standard protocol during bolus administration of intravenous contrast. Multiplanar CT image reconstructions and MIPs were obtained to evaluate the vascular anatomy. Multidetector CT imaging of the abdomen and pelvis was performed using the standard protocol during bolus administration of intravenous contrast. CONTRAST:  156m OMNIPAQUE IOHEXOL 350 MG/ML SOLN COMPARISON:  Chest x-ray 07/21/2015 FINDINGS: CTA CHEST FINDINGS Lungs are adequately inflated with tiny amount of bilateral pleural fluid. Subtle patchy reticulonodular opacification over the right middle lobe and central right upper lobe. Subtle peripheral opacification of the right lower lobe likely atelectasis. Airways are within normal. Heart is normal in size. Pulmonary arterial system is well opacified without evidence of emboli. No significant mediastinal  or hilar adenopathy. No significant axillary adenopathy. Remaining bones and soft tissues are within normal. CT ABDOMEN and PELVIS FINDINGS There is diffuse heterogeneity of the liver in the form of multiple small well-defined hypodensities a possible mild hepatic enlargement. Numerous ill-defined hypodensities throughout the spleen. Spleen at the upper limits of normal in size. Gallbladder is contracted. The pancreas and adrenal glands are within normal. Stomach is within normal. Kidneys are normal. Mild calcified atherosclerotic plaque over the femoral arteries as the vascular structures  are otherwise within normal. Mild fecal retention throughout the colon. Appendix is normal. Small bowel is unremarkable. There is no free fluid or focal inflammatory change. No significant adenopathy. Pelvic images demonstrate evidence of previous bilateral tubal ligation. An IUD is present over the mid to lower endometrial canal. The bladder, ovaries and rectum are within normal. Few mildly prominent symmetric superficial inguinal lymph nodes bilaterally. Mild diffuse subcutaneous edema. Mild degenerate change of the spine Review of the MIP images confirms the above findings. IMPRESSION: No evidence of pulmonary embolism. Subtle patchy hazy reticulonodular opacification over the right lung which may be due to right atypical infectious or inflammatory process. Small amount of bilateral pleural fluid and right basilar atelectasis. Consider follow-up chest CT 4-6 weeks. Possible mild hepatic splenomegaly with multiple small ill-defined hypodensities within the liver and spleen more prominent within the spleen. No significant adenopathy other than mild prominence of bilateral superficial inguinal nodes. Findings may be seen in malignancy such as lymphoma/leukemia in addition to metastatic disease. Infectious processes such as candidiasis may also be considered in an immunocompromised patient. Granulomatous disease such as TB/ sarcoidosis is less likely. Mild diffuse subcutaneous edema. IUD over the lower endometrial canal. Electronically Signed   By: Marin Olp M.D.   On: 07/21/2015 21:06   Ct Abdomen Pelvis W Contrast  07/21/2015  CLINICAL DATA:  Cough 4 weeks. Bilateral lower extremity edema. Patient feels lump over left side as well as right side. EXAM: CT ANGIOGRAPHY CHEST CT ABDOMEN AND PELVIS WITH CONTRAST TECHNIQUE: Multidetector CT imaging of the chest was performed using the standard protocol during bolus administration of intravenous contrast. Multiplanar CT image reconstructions and MIPs were obtained  to evaluate the vascular anatomy. Multidetector CT imaging of the abdomen and pelvis was performed using the standard protocol during bolus administration of intravenous contrast. CONTRAST:  153m OMNIPAQUE IOHEXOL 350 MG/ML SOLN COMPARISON:  Chest x-ray 07/21/2015 FINDINGS: CTA CHEST FINDINGS Lungs are adequately inflated with tiny amount of bilateral pleural fluid. Subtle patchy reticulonodular opacification over the right middle lobe and central right upper lobe. Subtle peripheral opacification of the right lower lobe likely atelectasis. Airways are within normal. Heart is normal in size. Pulmonary arterial system is well opacified without evidence of emboli. No significant mediastinal or hilar adenopathy. No significant axillary adenopathy. Remaining bones and soft tissues are within normal. CT ABDOMEN and PELVIS FINDINGS There is diffuse heterogeneity of the liver in the form of multiple small well-defined hypodensities a possible mild hepatic enlargement. Numerous ill-defined hypodensities throughout the spleen. Spleen at the upper limits of normal in size. Gallbladder is contracted. The pancreas and adrenal glands are within normal. Stomach is within normal. Kidneys are normal. Mild calcified atherosclerotic plaque over the femoral arteries as the vascular structures are otherwise within normal. Mild fecal retention throughout the colon. Appendix is normal. Small bowel is unremarkable. There is no free fluid or focal inflammatory change. No significant adenopathy. Pelvic images demonstrate evidence of previous bilateral tubal ligation. An IUD is present over  the mid to lower endometrial canal. The bladder, ovaries and rectum are within normal. Few mildly prominent symmetric superficial inguinal lymph nodes bilaterally. Mild diffuse subcutaneous edema. Mild degenerate change of the spine Review of the MIP images confirms the above findings. IMPRESSION: No evidence of pulmonary embolism. Subtle patchy hazy  reticulonodular opacification over the right lung which may be due to right atypical infectious or inflammatory process. Small amount of bilateral pleural fluid and right basilar atelectasis. Consider follow-up chest CT 4-6 weeks. Possible mild hepatic splenomegaly with multiple small ill-defined hypodensities within the liver and spleen more prominent within the spleen. No significant adenopathy other than mild prominence of bilateral superficial inguinal nodes. Findings may be seen in malignancy such as lymphoma/leukemia in addition to metastatic disease. Infectious processes such as candidiasis may also be considered in an immunocompromised patient. Granulomatous disease such as TB/ sarcoidosis is less likely. Mild diffuse subcutaneous edema. IUD over the lower endometrial canal. Electronically Signed   By: Marin Olp M.D.   On: 07/21/2015 21:06   US Venous Img Lower Bilateral  07/21/2015  CLINICAL DATA:  Bilateral lower extremity edema left greater than right with posterior calf tenderness bilaterally for 2 weeks EXAM: BILATERAL LOWER EXTREMITY VENOUS DOPPLER ULTRASOUND TECHNIQUE: Gray-scale sonography with graded compression, as well as color Doppler and duplex ultrasound were performed to evaluate the lower extremity deep venous systems from the level of the common femoral vein and including the common femoral, femoral, profunda femoral, popliteal and calf veins including the posterior tibial, peroneal and gastrocnemius veins when visible. The superficial great saphenous vein was also interrogated. Spectral Doppler was utilized to evaluate flow at rest and with distal augmentation maneuvers in the common femoral, femoral and popliteal veins. COMPARISON:  None. FINDINGS: RIGHT LOWER EXTREMITY Common Femoral Vein: No evidence of thrombus. Normal compressibility, respiratory phasicity and response to augmentation. Saphenofemoral Junction: No evidence of thrombus. Normal compressibility and flow on color  Doppler imaging. Profunda Femoral Vein: No evidence of thrombus. Normal compressibility and flow on color Doppler imaging. Femoral Vein: No evidence of thrombus. Normal compressibility, respiratory phasicity and response to augmentation. Popliteal Vein: No evidence of thrombus. Normal compressibility, respiratory phasicity and response to augmentation. Calf Veins: No evidence of thrombus. Normal compressibility and flow on color Doppler imaging. Superficial Great Saphenous Vein: No evidence of thrombus. Normal compressibility and flow on color Doppler imaging. Venous Reflux:  None. Other Findings: Enlarged inguinal lymph node measuring 26 x 8 x 13 mm LEFT LOWER EXTREMITY Common Femoral Vein: No evidence of thrombus. Normal compressibility, respiratory phasicity and response to augmentation. Saphenofemoral Junction: No evidence of thrombus. Normal compressibility and flow on color Doppler imaging. Profunda Femoral Vein: No evidence of thrombus. Normal compressibility and flow on color Doppler imaging. Femoral Vein: No evidence of thrombus. Normal compressibility, respiratory phasicity and response to augmentation. Popliteal Vein: No evidence of thrombus. Normal compressibility, respiratory phasicity and response to augmentation. Calf Veins: No evidence of thrombus. Normal compressibility and flow on color Doppler imaging. Superficial Great Saphenous Vein: No evidence of thrombus. Normal compressibility and flow on color Doppler imaging. Venous Reflux:  None. Other Findings: Enlarged inguinal lymph node measuring 21 x 6 x 10 mm IMPRESSION: No evidence of deep venous thrombosis. Electronically Signed   By: Skipper Cliche M.D.   On: 07/21/2015 17:49    ASSESSMENT: Suspicious lymphadenopathy and splenic lesions, anasarca.  PLAN:    1. Lymphadenopathy/splenic lesions: Patient's case was discussed at tumor board and was determined to proceed with surgical inguinal lymph  node excision. If positive for malignancy,  will then proceed with PET scan and bone marrow biopsy. Patient will return to clinic one week after her biopsy to discuss her results and further diagnostic planning if necessary. 2. Anasarca: Unclear etiology. Will consider 24-hour urine protein in the near future to assess for proteinuria. 3. Hyperglycemia: Continue current diabetic medications as prescribed.  Approximately 45 minutes was spent in discussion of which greater than 50% was consultation.   Patient expressed understanding and was in agreement with this plan. She also understands that She can call clinic at any time with any questions, concerns, or complaints.   Lloyd Huger, MD   07/29/2015 9:45 AM

## 2015-07-31 LAB — COMP PANEL: LEUKEMIA/LYMPHOMA

## 2015-08-01 ENCOUNTER — Other Ambulatory Visit: Payer: Self-pay | Admitting: Family Medicine

## 2015-08-01 ENCOUNTER — Encounter: Payer: Self-pay | Admitting: General Surgery

## 2015-08-01 ENCOUNTER — Telehealth: Payer: Self-pay | Admitting: Oncology

## 2015-08-01 ENCOUNTER — Ambulatory Visit (INDEPENDENT_AMBULATORY_CARE_PROVIDER_SITE_OTHER): Payer: Medicaid Other | Admitting: General Surgery

## 2015-08-01 VITALS — BP 110/62 | HR 82 | Resp 14 | Ht 65.0 in | Wt 198.0 lb

## 2015-08-01 DIAGNOSIS — L732 Hidradenitis suppurativa: Secondary | ICD-10-CM | POA: Diagnosis not present

## 2015-08-01 DIAGNOSIS — R591 Generalized enlarged lymph nodes: Secondary | ICD-10-CM

## 2015-08-01 DIAGNOSIS — K769 Liver disease, unspecified: Secondary | ICD-10-CM

## 2015-08-01 MED ORDER — DOXYCYCLINE HYCLATE 100 MG PO TABS: 100.0000 mg | ORAL_TABLET | Freq: Two times a day (BID) | ORAL | Status: DC

## 2015-08-01 NOTE — Progress Notes (Signed)
Patient ID: Cassandra Black, female   DOB: [birth year], 45 y.o.   MRN: [id number]  Chief Complaint  Patient presents with  . Other    groin node    HPI Cassandra Black is a 45 y.o. female.  Here today for evaluation of a groin node that needs biopsy. She recently presented with anasarca to the ER and subsequently has improved with the use of Lasix. In the course of workup, she was noted to have some lymphadenopathy and splenic lesions. Lymphoma was noted to be suspected. 10/10 left flank pain since Sunday with episodes of vomiting. Today states hematemesis. Currently has a cough in which she was told was from fluid in her lungs. 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 I have reviewed the history of present illness with the patient.  HPI  Past Medical History  Diagnosis Date  . Diabetes mellitus without complication (HCC)   . Hypertension   . Asthma   . HSV infection   . Anemia   . 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 Glaucoma   . Cataract   . Diabetic retinopathy of both eyes (HCC)     History reviewed. No pertinent past surgical history.  Family History  Problem Relation Age of Onset  . COPD Father   . Diabetes Mother   . Cancer Mother     pancreatic    Social History Social History  Substance Use Topics  . Smoking status: Never Smoker   . Smokeless tobacco: Never Used  . Alcohol Use: Yes     Comment: occasionally    No Known Allergies  Current Outpatient Prescriptions  Medication Sig Dispense Refill  . albuterol (PROAIR HFA) 108 (90 Base) MCG/ACT inhaler Inhale 90 mcg into the lungs as needed.    . carvedilol (COREG) 3.125 MG tablet Take 3.125 mg by mouth 2 (two) times daily.    . furosemide (LASIX) 40 MG tablet Take 40 mg by mouth daily.    . insulin aspart (NOVOLOG) 100 UNIT/ML FlexPen Inject 22 Units into the skin 3 (three) times daily.    . Insulin Glargine (LANTUS SOLOSTAR) 100 UNIT/ML Solostar Pen Inject 45 Units  into the skin Nightly.    . Lancets 28G MISC     . lisinopril (PRINIVIL,ZESTRIL) 5 MG tablet Take 5 mg by mouth daily.    . metFORMIN (GLUCOPHAGE-XR) 500 MG 24 hr tablet Take 500 mg by mouth daily.    . valACYclovir (VALTREX) 500 MG tablet Take 500 mg by mouth daily.     No current facility-administered medications for this visit.    Review of Systems Review of Systems  Constitutional: Negative.   Respiratory: Positive for cough.   Cardiovascular: Negative.   Gastrointestinal: Positive for abdominal pain (left flank).    Blood pressure 110/62, pulse 82, resp. rate 14, height  (1.651 m), weight 198 lb (89.812 kg), last menstrual period 07/14/2015.  Physical Exam Physical Exam  Constitutional: She is oriented to person, place, and time. She appears well-developed and well-nourished.  HENT:  Mouth/Throat: Oropharynx is clear and moist.  Eyes: Conjunctivae are normal.  Neck: Neck supple.  Cardiovascular: Normal rate, regular rhythm and normal heart sounds.   Pulmonary/Chest: Effort normal and breath sounds normal.  Abdominal: Soft. Normal appearance and bowel sounds are normal.  Hidradenitis bilateral groin   Lymphadenopathy:    She has no cervical adenopathy.    She has no axillary adenopathy.       Right:  Inguinal adenopathy present.  2cm long palpable node superficial in right groin  Neurological: She is alert and oriented to person, place, and time.  Skin: Skin is warm and dry.  Psychiatric: Her behavior is normal.    Data Reviewed Recent CT, Dr. Milinda Cave notes  Assessment    Abnormal imaging with suspicion for lymphoma. The right groin node does feel suspicious at all. Likely it is result of recurring hidradenitis. Unlikely the groin node will yield dx other than inflammatory. Discuss with Dr. Orlie Dakin. Pt advised fully      Plan    Collaborate with Dr. Orlie Dakin to determine how to proceed    PCP:  Titus Mould Ref Dr  Jhonnie Garner 08/01/2015, 2:57 PM

## 2015-08-01 NOTE — Telephone Encounter (Signed)
Patient just left Dr. Evette Cristal. He does not want to do bx via groin due to her having an infection in that area at this time. Please call her asap to discuss options. [phone]

## 2015-08-01 NOTE — Patient Instructions (Signed)
The patient is aware to call back for any questions or concerns.  

## 2015-08-01 NOTE — Telephone Encounter (Signed)
Cassandra Black, What to do

## 2015-08-02 ENCOUNTER — Telehealth: Payer: Self-pay | Admitting: Oncology

## 2015-08-02 ENCOUNTER — Other Ambulatory Visit: Payer: Self-pay | Admitting: Oncology

## 2015-08-02 ENCOUNTER — Other Ambulatory Visit: Payer: Self-pay | Admitting: Family Medicine

## 2015-08-02 NOTE — Telephone Encounter (Signed)
I was planning on seeing her first, but if she wants to proceed with liver biopsy immediately, that would be ok.  Thanks.  I'll put the order in EPIC if you can have it scheduled.

## 2015-08-02 NOTE — Telephone Encounter (Signed)
She said she spoke with Verlon Au last night about trying to get an [place] of liver set up for today or tomorrow and she said she is feeling desperate and wants to know if it has been scheduled yet. Please advise: [phone].

## 2015-08-02 NOTE — Telephone Encounter (Signed)
Thanks for putting in the order [facility].

## 2015-08-03 ENCOUNTER — Telehealth: Payer: Self-pay | Admitting: Family Medicine

## 2015-08-03 ENCOUNTER — Other Ambulatory Visit: Payer: Self-pay | Admitting: Family Medicine

## 2015-08-03 MED ORDER — PROCHLORPERAZINE MALEATE 10 MG PO TABS: 10.0000 mg | ORAL_TABLET | Freq: Four times a day (QID) | ORAL | Status: DC | PRN

## 2015-08-03 NOTE — Telephone Encounter (Signed)
See tele note

## 2015-08-08 ENCOUNTER — Other Ambulatory Visit: Payer: Self-pay | Admitting: General Surgery

## 2015-08-09 ENCOUNTER — Ambulatory Visit
Admission: RE | Admit: 2015-08-09 | Discharge: 2015-08-09 | Disposition: A | Discharge status: Home / Self Care | Payer: Medicaid Other | Source: Ambulatory Visit | Attending: Family Medicine | Admitting: Family Medicine

## 2015-08-09 ENCOUNTER — Other Ambulatory Visit: Payer: Self-pay | Admitting: Family Medicine

## 2015-08-09 DIAGNOSIS — K7689 Other specified diseases of liver: Secondary | ICD-10-CM | POA: Diagnosis present

## 2015-08-09 DIAGNOSIS — K769 Liver disease, unspecified: Secondary | ICD-10-CM

## 2015-08-09 MED ORDER — SODIUM CHLORIDE 0.9 % IV SOLN: INTRAVENOUS | Status: DC

## 2015-08-14 ENCOUNTER — Telehealth: Payer: Self-pay | Admitting: *Deleted

## 2015-08-14 NOTE — Telephone Encounter (Signed)
Called to request that Dr Orlie DakinFinnegan call [facility] TODAY she wants to transfer her care and get a second opinion and they need to her from Dr Orlie DakinFinnegan before they will schedule her to be seen. Please call [phone] and speak with Amy or anyone else there

## 2015-08-14 NOTE — Telephone Encounter (Signed)
I called and spoke with Cassandra Black and let her know patient expressed to me [facility] and this was all patient initiated. She said that my returning call was all she needed and will contact patient to schedule her and I gave her our fax number to send records request. I let her know that Dr Orlie DakinFinnegan is willing to speak with MD once she is assigned to one. She thanked me for calling

## 2015-12-12 ENCOUNTER — Emergency Department: Payer: Medicaid Other

## 2015-12-12 ENCOUNTER — Encounter: Payer: Self-pay | Admitting: Emergency Medicine

## 2015-12-12 ENCOUNTER — Emergency Department
Admission: EM | Admit: 2015-12-12 | Discharge: 2015-12-12 | Disposition: A | Discharge status: Home / Self Care | Payer: Medicaid Other | Attending: Emergency Medicine | Admitting: Emergency Medicine

## 2015-12-12 DIAGNOSIS — Z7984 Long term (current) use of oral hypoglycemic drugs: Secondary | ICD-10-CM | POA: Diagnosis not present

## 2015-12-12 DIAGNOSIS — Y999 Unspecified external cause status: Secondary | ICD-10-CM | POA: Insufficient documentation

## 2015-12-12 DIAGNOSIS — Z794 Long term (current) use of insulin: Secondary | ICD-10-CM | POA: Diagnosis not present

## 2015-12-12 DIAGNOSIS — E11319 Type 2 diabetes mellitus with unspecified diabetic retinopathy without macular edema: Secondary | ICD-10-CM | POA: Diagnosis not present

## 2015-12-12 DIAGNOSIS — Y9289 Other specified places as the place of occurrence of the external cause: Secondary | ICD-10-CM | POA: Insufficient documentation

## 2015-12-12 DIAGNOSIS — S86912A Strain of unspecified muscle(s) and tendon(s) at lower leg level, left leg, initial encounter: Secondary | ICD-10-CM | POA: Insufficient documentation

## 2015-12-12 DIAGNOSIS — S8392XA Sprain of unspecified site of left knee, initial encounter: Secondary | ICD-10-CM | POA: Diagnosis not present

## 2015-12-12 DIAGNOSIS — J45909 Unspecified asthma, uncomplicated: Secondary | ICD-10-CM | POA: Diagnosis not present

## 2015-12-12 DIAGNOSIS — I10 Essential (primary) hypertension: Secondary | ICD-10-CM | POA: Diagnosis not present

## 2015-12-12 DIAGNOSIS — Z79899 Other long term (current) drug therapy: Secondary | ICD-10-CM | POA: Insufficient documentation

## 2015-12-12 DIAGNOSIS — Y9389 Activity, other specified: Secondary | ICD-10-CM | POA: Diagnosis not present

## 2015-12-12 DIAGNOSIS — W010XXA Fall on same level from slipping, tripping and stumbling without subsequent striking against object, initial encounter: Secondary | ICD-10-CM | POA: Diagnosis not present

## 2015-12-12 DIAGNOSIS — S8992XA Unspecified injury of left lower leg, initial encounter: Secondary | ICD-10-CM | POA: Diagnosis present

## 2015-12-12 DIAGNOSIS — Z8571 Personal history of Hodgkin lymphoma: Secondary | ICD-10-CM | POA: Diagnosis not present

## 2015-12-12 DIAGNOSIS — T148XXA Other injury of unspecified body region, initial encounter: Secondary | ICD-10-CM

## 2015-12-12 LAB — POCT PREGNANCY, URINE: Preg Test, Ur: NEGATIVE

## 2015-12-12 MED ORDER — IBUPROFEN 800 MG PO TABS: 800.0000 mg | ORAL_TABLET | Freq: Three times a day (TID) | ORAL | Status: DC | PRN

## 2015-12-12 MED ORDER — CYCLOBENZAPRINE HCL 10 MG PO TABS: 10.0000 mg | ORAL_TABLET | Freq: Three times a day (TID) | ORAL | Status: DC | PRN

## 2015-12-12 MED ORDER — OXYCODONE-ACETAMINOPHEN 5-325 MG PO TABS: 1.0000 | ORAL_TABLET | Freq: Four times a day (QID) | ORAL | Status: DC | PRN

## 2015-12-12 NOTE — Discharge Instructions (Signed)
You should continue to use ice and take ibuprofen. Use pain medicine as needed.  If not improving can follow-up with the orthopedist.

## 2015-12-12 NOTE — ED Notes (Signed)
See triage note.states she slipped in water yesterday at [employer] states she bent back for left knee and right leg went out straight  Ambulates well to treatment area

## 2015-12-12 NOTE — ED Provider Notes (Signed)
[facility] Emergency Department Provider Note  ____________________________________________  Time seen: Approximately 6:52 PM  I have reviewed the triage vital signs and the nursing notes.   HISTORY  Chief Complaint Fall    HPI Cassandra Black is a 45 y.o. female who slipped yesterday while shopping at [facility], landing on her buttocks with her left leg underneath and behind her, and her right leg out in front of her.  She has been able to walk but is having increasing soreness to her lower extremity's. Pain is worse with walking, bending, squatting.  No prior hx of LE  Fractures.   Past Medical History  Diagnosis Date  . Diabetes mellitus without complication (HCC)   . Hypertension   . Asthma   . HSV infection   . Anemia   . 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 Glaucoma   . Cataract   . Diabetic retinopathy of both eyes (HCC)     There are no active problems to display for this patient.   History reviewed. No pertinent past surgical history.  Current Outpatient Rx  Name  Route  Sig  Dispense  Refill  . albuterol (PROAIR HFA) 108 (90 Base) MCG/ACT inhaler   Inhalation   Inhale 90 mcg into the lungs as needed.         . carvedilol (COREG) 3.125 MG tablet   Oral   Take 3.125 mg by mouth 2 (two) times daily.         . cyclobenzaprine (FLEXERIL) 10 MG tablet   Oral   Take 1 tablet (10 mg total) by mouth 3 (three) times daily as needed for muscle spasms.   21 tablet   0   . doxycycline (VIBRA-TABS) 100 MG tablet   Oral   Take 1 tablet (100 mg total) by mouth 2 (two) times daily.   20 tablet   10   . furosemide (LASIX) 40 MG tablet   Oral   Take 40 mg by mouth daily.         Marland Kitchen ibuprofen (ADVIL,MOTRIN) 800 MG tablet   Oral   Take 1 tablet (800 mg total) by mouth every 8 (eight) hours as needed.   15 tablet   0   . insulin aspart (NOVOLOG) 100 UNIT/ML FlexPen   Subcutaneous   Inject 22 Units into the skin 3 (three)  times daily.         . Insulin Glargine (LANTUS SOLOSTAR) 100 UNIT/ML Solostar Pen   Subcutaneous   Inject 45 Units into the skin Nightly.         . Lancets 28G MISC               . lisinopril (PRINIVIL,ZESTRIL) 5 MG tablet   Oral   Take 5 mg by mouth daily.         . metFORMIN (GLUCOPHAGE-XR) 500 MG 24 hr tablet   Oral   Take 500 mg by mouth daily.         Marland Kitchen oxyCODONE-acetaminophen (ROXICET) 5-325 MG tablet   Oral   Take 1 tablet by mouth every 6 (six) hours as needed.   20 tablet   0   . prochlorperazine (COMPAZINE) 10 MG tablet   Oral   Take 1 tablet (10 mg total) by mouth every 6 (six) hours as needed for nausea or vomiting.   30 tablet   0   . valACYclovir (VALTREX) 500 MG tablet   Oral   Take 500  mg by mouth daily.           Allergies Review of patient's allergies indicates no known allergies.  Family History  Problem Relation Age of Onset  . COPD Father   . Diabetes Mother   . Cancer Mother     pancreatic    Social History Social History  Substance Use Topics  . Smoking status: Never Smoker   . Smokeless tobacco: Never Used  . Alcohol Use: Yes     Comment: occasionally    Review of Systems Constitutional: No fever/chills Eyes: No visual changes. ENT: No sore throat. Cardiovascular: Denies chest pain. Respiratory: Denies shortness of breath. Gastrointestinal: No abdominal pain.  No nausea, no vomiting.  No diarrhea.  No constipation. Genitourinary: Negative for dysuria. Musculoskeletal: per HPI Skin: Negative for rash. Neurological: Negative for headaches, focal weakness or numbness. 10-point ROS otherwise negative.  ____________________________________________   PHYSICAL EXAM:  VITAL SIGNS: ED Triage Vitals  Enc Vitals Group     BP 12/12/15 1822 119/77 mmHg     Pulse Rate 12/12/15 1822 98     Resp 12/12/15 1822 18     Temp 12/12/15 1822 98.2 F (36.8 C)     Temp Source 12/12/15 1822 Oral     SpO2 12/12/15 1822 100 %      Weight 12/12/15 1822 194 lb (87.998 kg)     Height 12/12/15 1822 5\' 5"  (1.651 m)     Head Cir --      Peak Flow --      Pain Score 12/12/15 1823 8     Pain Loc --      Pain Edu? --      Excl. in GC? --     Constitutional: Alert and oriented. Well appearing and in no acute distress. Eyes: Conjunctivae are normal.   Mouth/Throat: Mucous membranes are moist Neck:  Supple.  No adenopathy.   Cardiovascular: Normal rate, regular rhythm. Grossly normal heart sounds.  Good peripheral circulation. Respiratory: Normal respiratory effort.  No retractions. Lungs CTAB. Musculoskeletal: Left knee: Tender over the patella with mild swelling along with mild joint line tenderness medial and lateral. No laxity. Mild tenderness with valgus and varus stress. Neurologic:  Normal speech and language. No gross focal neurologic deficits are appreciated. No gait instability. Skin:  Skin is warm, dry and intact. No rash noted. Psychiatric: Mood and affect are normal. Speech and behavior are normal.  ____________________________________________   LABS (all labs ordered are listed, but only abnormal results are displayed)  Labs Reviewed  POCT PREGNANCY, URINE  POC URINE PREG, ED   ____________________________________________  EKG   ____________________________________________  RADIOLOGY  CLINICAL DATA: Status post fall.  EXAM: PELVIS - 1-2 VIEW  COMPARISON: None.  FINDINGS: There is no evidence of pelvic fracture or diastasis. No pelvic bone lesions are seen.  Intrauterine device in the pelvis.  IMPRESSION: No acute osseous injury of the pelvis.   Electronically Signed  By: Elige KoHetal Patel  On: 12/12/2015 19:47   CLINICAL DATA: Status post fall. Knee pain.  EXAM: LEFT KNEE - COMPLETE 4+ VIEW  COMPARISON: None.  FINDINGS: No evidence of fracture, dislocation, or joint effusion. Tiny medial femorotibial compartment marginal osteophytes. Joint spaces  are maintained. Soft tissues are unremarkable.  IMPRESSION: No acute osseous injury of the left knee.   Electronically Signed  By: Elige KoHetal Patel  On: 12/12/2015 19:48 ____________________________________________   PROCEDURES  Procedure(s) performed: None  Critical Care performed: No  ____________________________________________   INITIAL IMPRESSION / ASSESSMENT  AND PLAN / ED COURSE  Pertinent labs & imaging results that were available during my care of the patient were reviewed by me and considered in my medical decision making (see chart for details).  45 y/o who fell at [facility] yesterday injuring her lower back, legs and knees.   Xray's of pelvis and knee, left are without acute findings.  Suspect contusion of her buttocks with strains of her leg muscles and a left knee sprain.  Encouraged rest, ice, ibuprofen. Can take norco prn.  Should follow up with the Orthopedist or her primary physician if not improving. ____________________________________________   FINAL CLINICAL IMPRESSION(S) / ED DIAGNOSES  Final diagnoses:  Contusion  Knee sprain, left, initial encounter  Muscle strain      Ignacia Bayley, PA-C 12/12/15 1952  Ignacia Bayley, PA-C 12/12/15 1953  Jennye Moccasin, MD 12/12/15 2124

## 2015-12-12 NOTE — ED Notes (Signed)
[facility]. Pt denies hitting head or LOC. Pt reports bilateral lower extremity pain.

## 2016-06-09 ENCOUNTER — Other Ambulatory Visit: Payer: Self-pay | Admitting: Gastroenterology

## 2016-06-09 DIAGNOSIS — R932 Abnormal findings on diagnostic imaging of liver and biliary tract: Secondary | ICD-10-CM

## 2016-06-24 ENCOUNTER — Ambulatory Visit
Admission: RE | Admit: 2016-06-24 | Discharge: 2016-06-24 | Disposition: A | Discharge status: Home / Self Care | Payer: Medicaid Other | Source: Ambulatory Visit | Attending: Gastroenterology | Admitting: Gastroenterology

## 2016-06-24 DIAGNOSIS — R160 Hepatomegaly, not elsewhere classified: Secondary | ICD-10-CM | POA: Insufficient documentation

## 2016-06-24 DIAGNOSIS — R932 Abnormal findings on diagnostic imaging of liver and biliary tract: Secondary | ICD-10-CM | POA: Insufficient documentation

## 2016-06-24 MED ORDER — GADOXETATE DISODIUM 0.25 MMOL/ML IV SOLN
9.0000 mL | Freq: Once | INTRAVENOUS | Status: AC | PRN
  Administered 2016-06-24: 9 mL via INTRAVENOUS

## 2016-08-19 IMAGING — US US ABDOMEN LIMITED
1 series · 13 of 25 positions shown · non-contrast
Comparison: CT of the abdomen and pelvis on 07/21/2015

CLINICAL DATA: Nodularity of liver and spleen detected by prior CT.
The patient presents for possible ultrasound-guided biopsy of the
liver. Ultrasound is performed prior to potential biopsy.

EXAM:
LIMITED ABDOMINAL ULTRASOUND

[Series 1: us abdomen limited · 0.22mm/px · 13 of 81 slices shown]
[im 1/81]
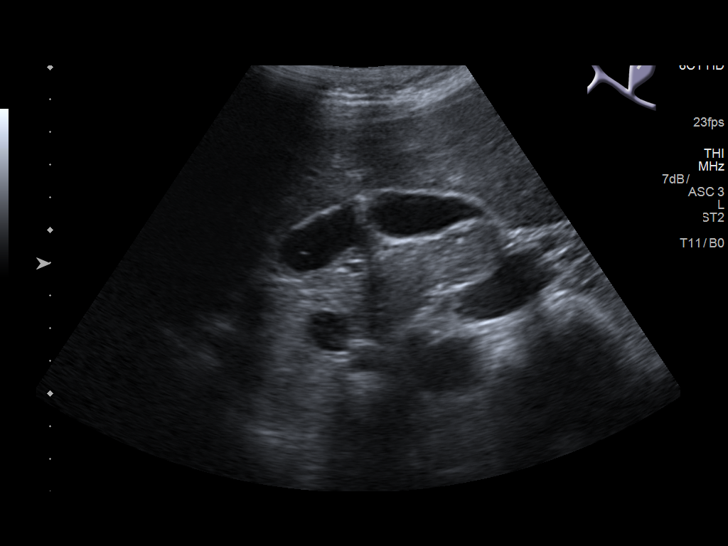
[im 7/81]
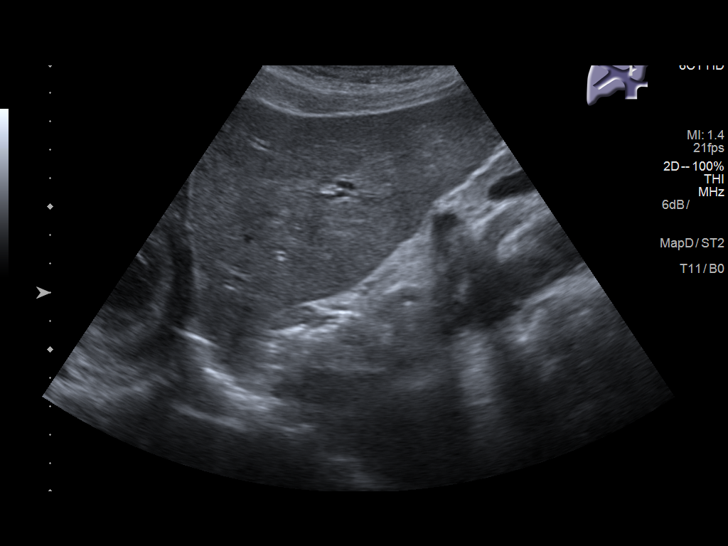
[im 14/81]
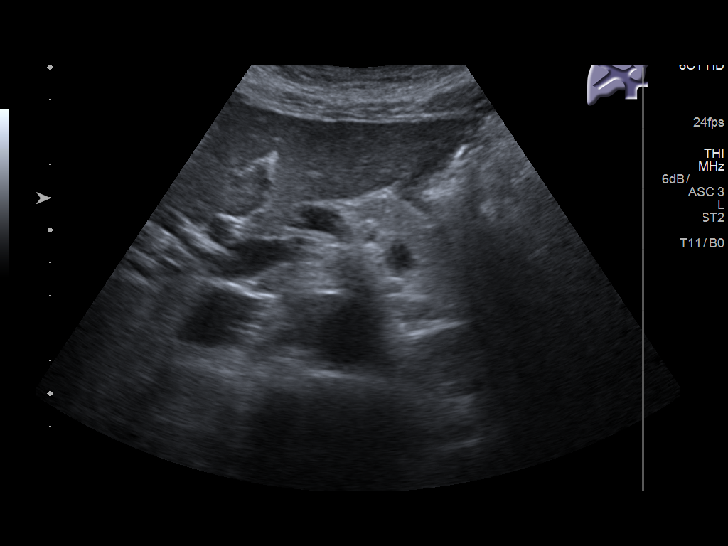
[im 21/81]
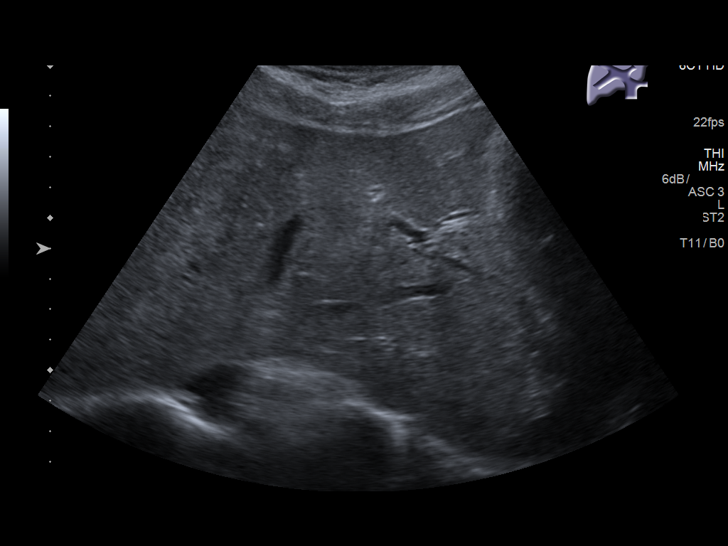
[im 27/81]
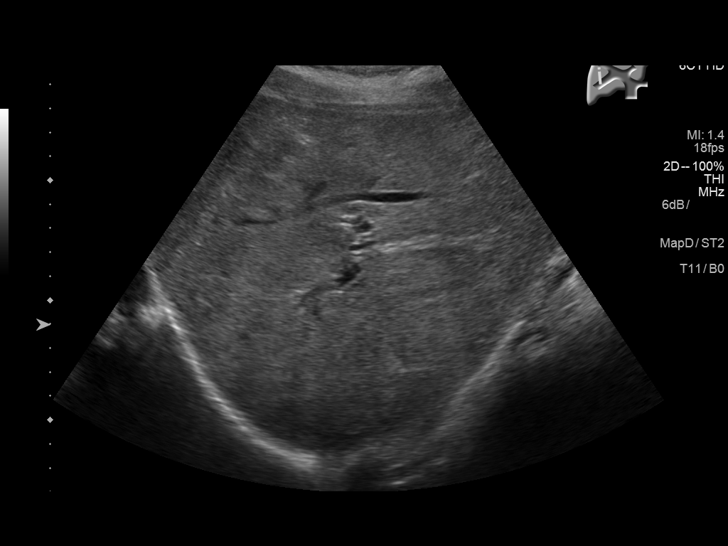
[im 34/81]
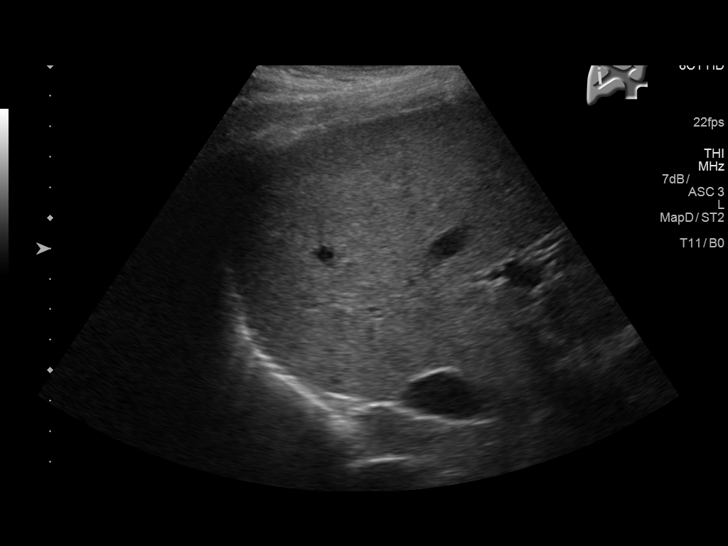
[im 41/81]
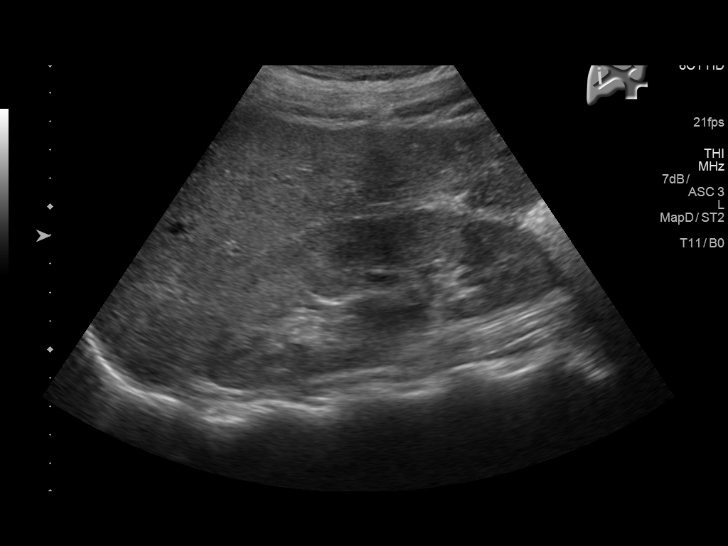
[im 47/81]
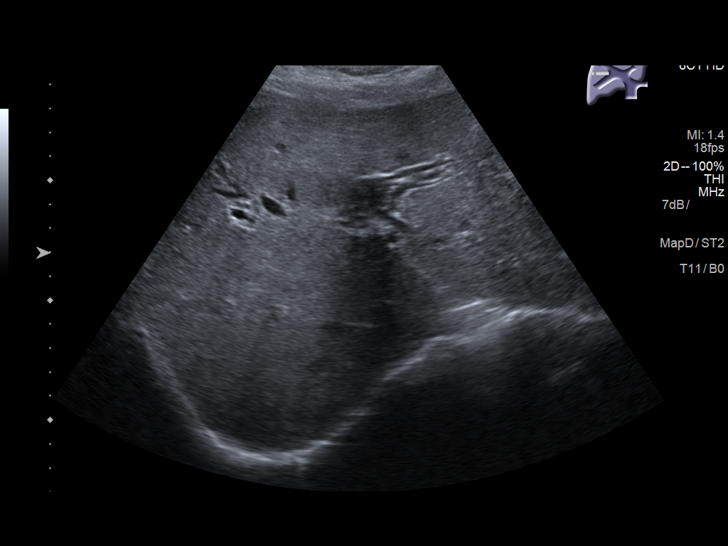
[im 54/81]
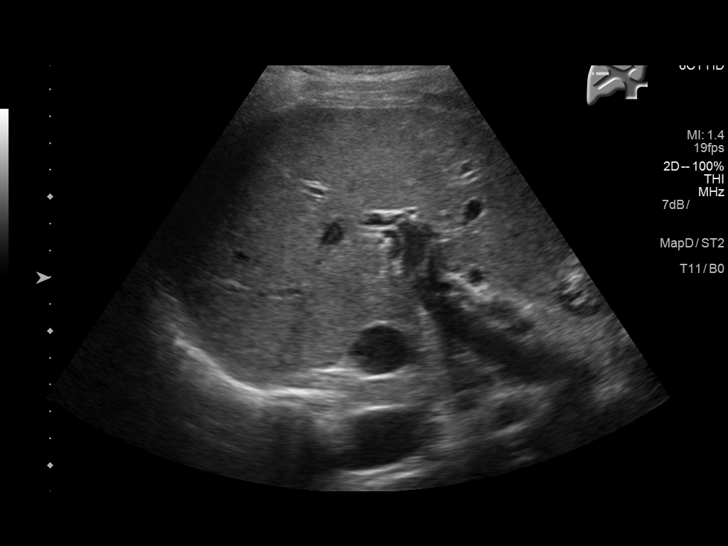
[im 61/81]
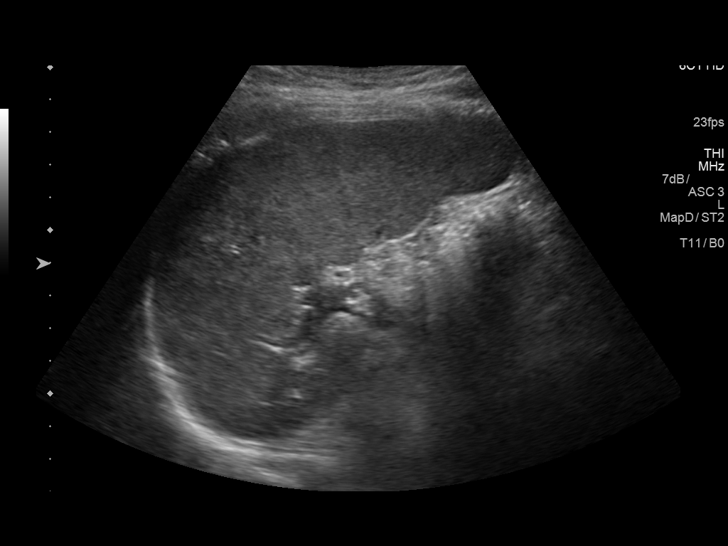
[im 67/81]
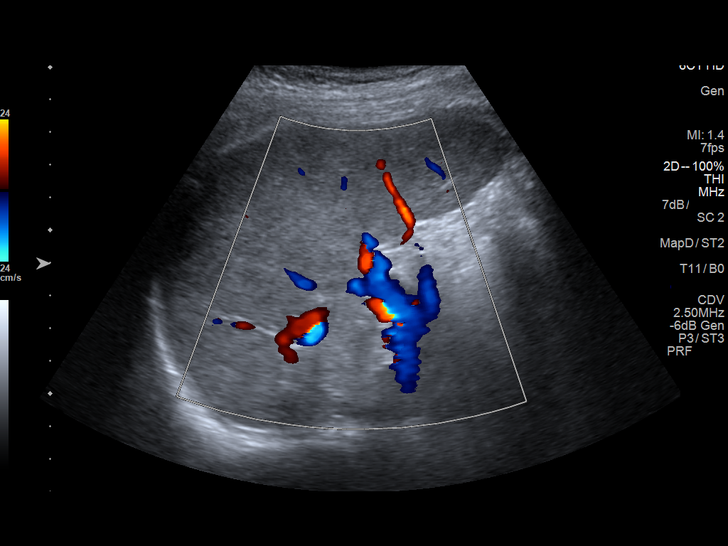
[im 74/81]
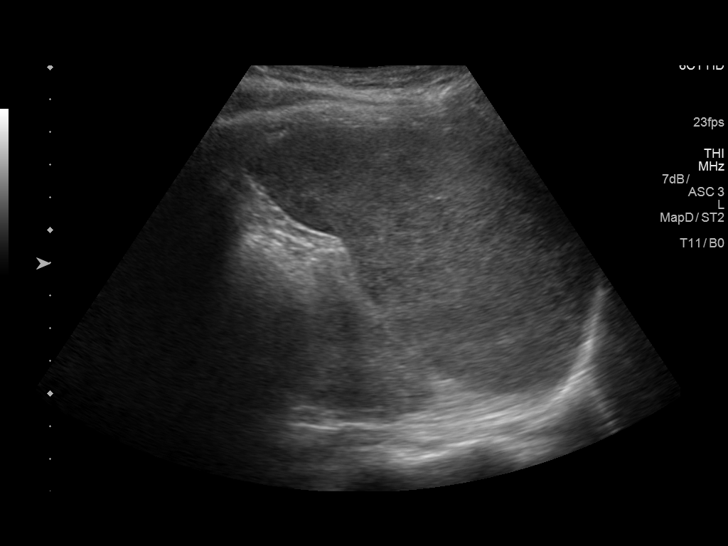
[im 81/81]
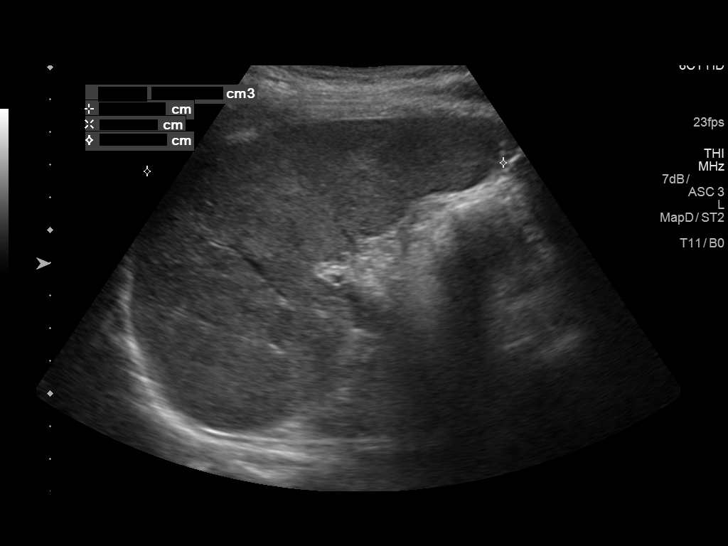

[13 of 25 positions shown; findings below may reference images not displayed]

FINDINGS: Sonographic evaluation of the entire liver was performed. There is
mild parenchymal heterogeneity noted without discrete focal lesion
by ultrasound. There is no lesion to target by ultrasound for
biopsy. No evidence of cirrhotic changes, significant steatosis or
intrahepatic biliary ductal dilatation. The portal vein is normally
patent. Hepatic veins also demonstrate color Doppler flow.

The spleen is mild to moderately enlarged with estimated volume of
500 mL by ultrasound. Splenic parenchyma is heterogeneous without
discrete focal lesion identified by ultrasound.

Incidental note is made of some relative increased echogenicity of
the right kidney without hydronephrosis. This may reflect a
component of chronic kidney disease. The patient does have a history
of diabetes.
IMPRESSION: 1. No evidence of discrete hepatic lesions by ultrasound. A lesion
therefore was not able to be targeted by ultrasound for biopsy.
Possible biopsy today was canceled. Recommend further evaluation of
the liver by MRI with and without gadolinium.
2. Splenomegaly with estimated splenic volume of 500 mL. The splenic
parenchyma is heterogeneous without discrete focal lesion
identified.
3. The prior CTA of the chest was also reviewed today. This
demonstrates some prominence of mediastinal lymph nodes in the
superior mediastinum and prevascular space as well as the subcarinal
region. Bilateral hilar lymph nodes are also mildly prominent, right
greater than left. This does raise the possibility of underlying
sarcoidosis as an explanation for the nodular appearance of the
liver and spleen by prior CT evaluation.
4. Increased echogenicity of the visualized right kidney may reflect
a component of chronic kidney disease.

## 2016-10-17 DIAGNOSIS — E113592 Type 2 diabetes mellitus with proliferative diabetic retinopathy without macular edema, left eye: Secondary | ICD-10-CM | POA: Insufficient documentation

## 2016-11-29 ENCOUNTER — Ambulatory Visit
Admission: EM | Admit: 2016-11-29 | Discharge: 2016-11-29 | Disposition: A | Discharge status: Home / Self Care | Payer: Medicaid Other | Attending: Family Medicine | Admitting: Family Medicine

## 2016-11-29 ENCOUNTER — Encounter: Payer: Self-pay | Admitting: Gynecology

## 2016-11-29 DIAGNOSIS — N751 Abscess of Bartholin's gland: Secondary | ICD-10-CM | POA: Diagnosis not present

## 2016-11-29 DIAGNOSIS — R102 Pelvic and perineal pain: Secondary | ICD-10-CM | POA: Diagnosis not present

## 2016-11-29 DIAGNOSIS — N750 Cyst of Bartholin's gland: Secondary | ICD-10-CM

## 2016-11-29 MED ORDER — SULFAMETHOXAZOLE-TRIMETHOPRIM 800-160 MG PO TABS: 1.0000 | ORAL_TABLET | Freq: Two times a day (BID) | ORAL | 0 refills | Status: DC

## 2016-11-29 MED ORDER — HYDROCODONE-ACETAMINOPHEN 5-325 MG PO TABS: 1.0000 | ORAL_TABLET | ORAL | 0 refills | Status: DC | PRN

## 2016-11-29 NOTE — Discharge Instructions (Signed)
Please follow-up with PCP or GYN physician in 2-3 days for packing removal. Return to the emergency department, urgent care facility if any fevers increasing pain worsening symptoms urgent changes in her health. Please and Biaxin as prescribed.

## 2016-11-29 NOTE — ED Provider Notes (Signed)
CSN: [id number]     Arrival date & time 11/29/16  0825 History   First MD Initiated Contact with Patient 11/29/16 [phone]     Chief Complaint  Patient presents with  . Cyst   (Consider location/radiation/quality/duration/timing/severity/associated sxs/prior Treatment) HPI  46 year old female presents of her department for evaluation of abscess to her left vaginal area. Patient has had pain and swelling 2 days. Pain is slightly increased last night. No fevers. She denies having any drainage. She has not had any medications for pain and she is not currently ill antibiotics. She has a history of systems area.  Past Medical History:  Diagnosis Date  . Anemia   . Asthma   . Cataract   . Diabetes mellitus without complication (HCC)   . Diabetic retinopathy of both eyes (HCC)   . Glaucoma   . HSV infection   . Hypertension   . Lymphoma (HCC) 07-13-15   liver/spleen   History reviewed. No pertinent surgical history. Family History  Problem Relation Age of Onset  . COPD Father   . Diabetes Mother   . Cancer Mother        pancreatic   Social History  Substance Use Topics  . Smoking status: Never Smoker  . Smokeless tobacco: Never Used  . Alcohol use Yes     Comment: occasionally   OB History    Gravida Para Term Preterm AB Living   3 1     2      SAB TAB Ectopic Multiple Live Births   2              Obstetric Comments   1st Menstrual Cycle:  15 1st Pregnancy:  26      Review of Systems  Constitutional: Negative for fever.  Genitourinary: Positive for vaginal pain. Negative for vaginal discharge.  Skin: Positive for wound.    Allergies  Patient has no known allergies.  Home Medications   Prior to Admission medications   Medication Sig Start Date End Date Taking? Authorizing Provider  insulin aspart (NOVOLOG) 100 UNIT/ML FlexPen Inject 22 Units into the skin 3 (three) times daily. 07/10/15  Yes [provider]  Insulin Glargine (LANTUS SOLOSTAR) 100 UNIT/ML  Solostar Pen Inject 45 Units into the skin Nightly. 07/10/15  Yes [provider]  Lancets 28G MISC  02/02/14  Yes [provider]  lisinopril (PRINIVIL,ZESTRIL) 5 MG tablet Take 5 mg by mouth daily. 06/01/15 11/29/16 Yes [provider]  metFORMIN (GLUCOPHAGE-XR) 500 MG 24 hr tablet Take 500 mg by mouth daily. 06/01/15 11/29/16 Yes [provider]  valACYclovir (VALTREX) 500 MG tablet Take 500 mg by mouth 2 (two) times daily.   Yes [provider]  albuterol (PROAIR HFA) 108 (90 Base) MCG/ACT inhaler Inhale 90 mcg into the lungs as needed. 06/01/15 05/31/16  [provider]  carvedilol (COREG) 3.125 MG tablet Take 3.125 mg by mouth 2 (two) times daily. 07/17/15 07/16/16  [provider]  cyclobenzaprine (FLEXERIL) 10 MG tablet Take 1 tablet (10 mg total) by mouth 3 (three) times daily as needed for muscle spasms. 12/12/15   Ignacia Bayleyumey, Robert, PA-C  doxycycline (VIBRA-TABS) 100 MG tablet Take 1 tablet (100 mg total) by mouth 2 (two) times daily. 08/01/15   Loann QuillHerring, Leslie F, NP  HYDROcodone-acetaminophen (NORCO) 5-325 MG tablet Take 1 tablet by mouth every 4 (four) hours as needed for moderate pain. 11/29/16   Evon SlackGaines, Thomas C, PA-C  ibuprofen (ADVIL,MOTRIN) 800 MG tablet Take 1 tablet (800 mg  total) by mouth every 8 (eight) hours as needed. 12/12/15   Ignacia Bayley, PA-C  oxyCODONE-acetaminophen (ROXICET) 5-325 MG tablet Take 1 tablet by mouth every 6 (six) hours as needed. 12/12/15   Ignacia Bayley, PA-C  prochlorperazine (COMPAZINE) 10 MG tablet Take 1 tablet (10 mg total) by mouth every 6 (six) hours as needed for nausea or vomiting. 08/03/15   Loann Quill, NP  sulfamethoxazole-trimethoprim (BACTRIM DS,SEPTRA DS) 800-160 MG tablet Take 1 tablet by mouth 2 (two) times daily. 11/29/16   Evon Slack, PA-C   Meds Ordered and Administered this Visit  Medications - No data to display  BP 129/86 (BP Location: Left Arm)   Pulse 95   Temp 98.9 F  (37.2 C) (Oral)   Resp 16   Ht 5\' 5"  (1.651 m)   Wt 189 lb (85.7 kg)   LMP 11/15/2016   SpO2 100%   BMI 31.45 kg/m  No data found.   Physical Exam  Constitutional: She is oriented to person, place, and time. She appears well-developed and well-nourished.  HENT:  Head: Normocephalic.  Eyes: Conjunctivae are normal.  Neck: Normal range of motion.  Cardiovascular: Normal rate.   Pulmonary/Chest: No respiratory distress.  Genitourinary: No vaginal discharge found.  Genitourinary Comments: Examination of the left labia shows a fluctuant abscessed with swelling throughout the labia.. There is no active drainage. She is tender to palpation. There is no sign of vaginal discharge or any vaginal lesions.  Neurological: She is alert and oriented to person, place, and time.  Psychiatric: She has a normal mood and affect.    Urgent Care Course     Procedures (including critical care time) Left labia is prepped with Betadine and then fluctuant areas and is injected with 1% lidocaine. Skin was cleansed again with Betadine and a small puncture wound is made with an 11 blade. Purulent drainage was removed from area of fluctuance. Patient's pain significantly improved. Small puncture wound packed with iodoform packing. Patient tolerated procedure well. Dressing applied.  Labs Review Labs Reviewed - No data to display  Imaging Review No results found.   Visual Acuity Review  Right Eye Distance:   Left Eye Distance:   Bilateral Distance:    Right Eye Near:   Left Eye Near:    Bilateral Near:         MDM   1. Cyst of left Bartholin's gland    46 year old female with small vaginal abscess. Incision and drainage is performed. Packing is applied for continued drainage. Patient will follow-up with GYN or PCP in 2-3 days for recheck and packing removal. She is placed on Bactrim DS. She is given Norco for pain. She is educated on signs and symptoms to return to the clinic for.    Evon Slack, [place] 11/29/16 [phone]

## 2016-11-29 NOTE — ED Triage Notes (Signed)
Patient c/o cyst at vaginal  Area x 2 days.

## 2016-12-27 ENCOUNTER — Encounter: Payer: Self-pay | Admitting: Gynecology

## 2016-12-27 ENCOUNTER — Ambulatory Visit
Admission: EM | Admit: 2016-12-27 | Discharge: 2016-12-27 | Disposition: A | Discharge status: Home / Self Care | Payer: Medicaid Other | Attending: Family | Admitting: Family

## 2016-12-27 DIAGNOSIS — Z113 Encounter for screening for infections with a predominantly sexual mode of transmission: Secondary | ICD-10-CM | POA: Diagnosis not present

## 2016-12-27 DIAGNOSIS — E1136 Type 2 diabetes mellitus with diabetic cataract: Secondary | ICD-10-CM | POA: Insufficient documentation

## 2016-12-27 DIAGNOSIS — Z794 Long term (current) use of insulin: Secondary | ICD-10-CM | POA: Diagnosis not present

## 2016-12-27 DIAGNOSIS — N898 Other specified noninflammatory disorders of vagina: Secondary | ICD-10-CM | POA: Diagnosis not present

## 2016-12-27 DIAGNOSIS — E11319 Type 2 diabetes mellitus with unspecified diabetic retinopathy without macular edema: Secondary | ICD-10-CM | POA: Insufficient documentation

## 2016-12-27 DIAGNOSIS — Z79899 Other long term (current) drug therapy: Secondary | ICD-10-CM | POA: Diagnosis not present

## 2016-12-27 DIAGNOSIS — H409 Unspecified glaucoma: Secondary | ICD-10-CM | POA: Insufficient documentation

## 2016-12-27 DIAGNOSIS — I10 Essential (primary) hypertension: Secondary | ICD-10-CM | POA: Diagnosis not present

## 2016-12-27 DIAGNOSIS — B9689 Other specified bacterial agents as the cause of diseases classified elsewhere: Secondary | ICD-10-CM | POA: Diagnosis not present

## 2016-12-27 DIAGNOSIS — N760 Acute vaginitis: Secondary | ICD-10-CM | POA: Diagnosis present

## 2016-12-27 DIAGNOSIS — B009 Herpesviral infection, unspecified: Secondary | ICD-10-CM | POA: Diagnosis not present

## 2016-12-27 LAB — WET PREP, GENITAL
Sperm: NONE SEEN
Trich, Wet Prep: NONE SEEN
Yeast Wet Prep HPF POC: NONE SEEN

## 2016-12-27 LAB — CHLAMYDIA/NGC RT PCR (ARMC ONLY)
CHLAMYDIA TR: NOT DETECTED
N GONORRHOEAE: NOT DETECTED

## 2016-12-27 MED ORDER — METRONIDAZOLE 500 MG PO TABS: 500.0000 mg | ORAL_TABLET | Freq: Two times a day (BID) | ORAL | 0 refills | Status: DC

## 2016-12-27 NOTE — Discharge Instructions (Signed)
Start antibiotic as prescribed.  We will call you with results of other tests.   It is not necessary to treat your female sexual partner since you have BV however I would advise him to also  be tested for STDs as we discussed.  If there is no improvement in your symptoms, or if there is any worsening of symptoms, or if you have any additional concerns, please return for re-evaluation; or, if we are closed, consider going to the Emergency Room for evaluation if symptoms urgent.

## 2016-12-27 NOTE — ED Triage Notes (Signed)
Per patient wants to check for Bateria vaginosis and yeast infection.

## 2016-12-27 NOTE — ED Provider Notes (Signed)
CSN: [id number]     Arrival date & time 12/27/16  1534 History   First MD Initiated Contact with Patient 12/27/16 1633     Chief Complaint  Patient presents with  . Vaginitis   (Consider location/radiation/quality/duration/timing/severity/associated sxs/prior Treatment) Chief complaint vaginal discharge after partner  'pulled out' and noticed white discharge around penis, one week ago .One episode since.   Partner was concerned for bacterial infection. NO increase in vaginal discharge or foul smell noticed.   No dysuria, flank pain, fever, N, V.   Would like testing GC/CH/TRICH. Recently had HIV testing. Declines serum STD testing.  No concern for pregnancy.  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       Recently seen in urgent care for Bartholin's gland cyst.  Past Medical History:  Diagnosis Date  . Anemia   . Asthma   . Cataract   . Diabetes mellitus without complication (HCC)   . Diabetic retinopathy of both eyes (HCC)   . Glaucoma   . HSV infection   . Hypertension   . Lymphoma (HCC) 07-13-15   liver/spleen   History reviewed. No pertinent surgical history. Family History  Problem Relation Age of Onset  . COPD Father   . Diabetes Mother   . Cancer Mother        pancreatic   Social History  Substance Use Topics  . Smoking status: Never Smoker  . Smokeless tobacco: Never Used  . Alcohol use Yes     Comment: occasionally   OB History    Gravida Para Term Preterm AB Living   3 1     2      SAB TAB Ectopic Multiple Live Births   2              Obstetric Comments   1st Menstrual Cycle:  15 1st Pregnancy:  26      Review of Systems  Constitutional: Negative for chills and fever.  Respiratory: Negative for cough.   Cardiovascular: Negative for chest pain and palpitations.  Gastrointestinal: Negative for nausea and vomiting.  Genitourinary: Negative for dysuria, frequency, vaginal bleeding, vaginal discharge and vaginal pain.    Allergies   Patient has no known allergies.  Home Medications   Prior to Admission medications   Medication Sig Start Date End Date Taking? Authorizing Provider  cyclobenzaprine (FLEXERIL) 10 MG tablet Take 1 tablet (10 mg total) by mouth 3 (three) times daily as needed for muscle spasms. 12/12/15  Yes Ignacia Bayleyumey, Robert, PA-C  doxycycline (VIBRA-TABS) 100 MG tablet Take 1 tablet (100 mg total) by mouth 2 (two) times daily. 08/01/15  Yes Herring, Vear ClockLeslie F, NP  HYDROcodone-acetaminophen (NORCO) 5-325 MG tablet Take 1 tablet by mouth every 4 (four) hours as needed for moderate pain. 11/29/16  Yes Evon SlackGaines, Thomas C, PA-C  ibuprofen (ADVIL,MOTRIN) 800 MG tablet Take 1 tablet (800 mg total) by mouth every 8 (eight) hours as needed. 12/12/15  Yes Ignacia Bayleyumey, Robert, PA-C  insulin aspart (NOVOLOG) 100 UNIT/ML FlexPen Inject 22 Units into the skin 3 (three) times daily. 07/10/15  Yes [provider]  Insulin Glargine (LANTUS SOLOSTAR) 100 UNIT/ML Solostar Pen Inject 45 Units into the skin Nightly. 07/10/15  Yes [provider]  Lancets 28G MISC  02/02/14  Yes [provider]  levonorgestrel (MIRENA) 20 MCG/24HR IUD 1 each by Intrauterine route once.   Yes [provider]  oxyCODONE-acetaminophen (ROXICET) 5-325 MG tablet Take 1 tablet by mouth every 6 (six) hours as needed. 12/12/15  Yes Ignacia Bayleyumey, Robert, PA-C  prochlorperazine (COMPAZINE) 10 MG tablet Take 1 tablet (10 mg total) by mouth every 6 (six) hours as needed for nausea or vomiting. 08/03/15  Yes Herring, Vear ClockLeslie F, NP  sulfamethoxazole-trimethoprim (BACTRIM DS,SEPTRA DS) 800-160 MG tablet Take 1 tablet by mouth 2 (two) times daily. 11/29/16  Yes Evon SlackGaines, Thomas C, PA-C  valACYclovir (VALTREX) 500 MG tablet Take 500 mg by mouth 2 (two) times daily.   Yes [provider]  albuterol (PROAIR HFA) 108 (90 Base) MCG/ACT inhaler Inhale 90 mcg into the lungs as needed. 06/01/15 05/31/16  [provider]  carvedilol (COREG) 3.125 MG tablet  Take 3.125 mg by mouth 2 (two) times daily. 07/17/15 07/16/16  [provider]  lisinopril (PRINIVIL,ZESTRIL) 5 MG tablet Take 5 mg by mouth daily. 06/01/15 11/29/16  [provider]  metFORMIN (GLUCOPHAGE-XR) 500 MG 24 hr tablet Take 500 mg by mouth daily. 06/01/15 11/29/16  [provider]  metroNIDAZOLE (FLAGYL) 500 MG tablet Take 1 tablet (500 mg total) by mouth 2 (two) times daily. 12/27/16   Allegra GranaArnett, Ray Gervasi G, FNP   Meds Ordered and Administered this Visit  Medications - No data to display  BP (!) 147/87 (BP Location: Left Arm)   Pulse 96   Temp 98.6 F (37 C) (Oral)   Resp 16   Wt 189 lb (85.7 kg)   LMP 12/06/2016 Comment: IUD  SpO2 100%   BMI 31.45 kg/m  No data found.   Physical Exam  Constitutional: She appears well-developed and well-nourished.  Eyes: Conjunctivae are normal.  Cardiovascular: Normal rate, regular rhythm, normal heart sounds and normal pulses.   Pulmonary/Chest: Effort normal and breath sounds normal. She has no wheezes. She has no rhonchi. She has no rales.  Abdominal: There is no CVA tenderness.  Genitourinary: There is no rash, tenderness or lesion on the right labia. There is no rash, tenderness or lesion on the left labia. Cervix exhibits no motion tenderness and no discharge. Right adnexum displays no mass, no tenderness and no fullness. Left adnexum displays no mass, no tenderness and no fullness. No erythema, tenderness or bleeding in the vagina. No foreign body in the vagina. No vaginal discharge found.  Genitourinary Comments: No vulvovaginal erythema. No lesions. Discharge is thick and white, not purulent nor abundant.   Neurological: She is alert.  Skin: Skin is warm and dry.  Psychiatric: She has a normal mood and affect. Her speech is normal and behavior is normal. Thought content normal.  Vitals reviewed.   Urgent Care Course     Procedures (including critical care time)  Labs Review Labs Reviewed  WET PREP,  GENITAL - Abnormal; Notable for the following:       Result Value   Clue Cells Wet Prep HPF POC PRESENT (*)    WBC, Wet Prep HPF POC FEW (*)    All other components within normal limits  CHLAMYDIA/NGC RT PCR [facility] ONLY)    Imaging Review No results found.         MDM   1. Bacterial vaginosis    Wet prep shows clue cells. Will treat for bacterial vaginosis. Advised probiotics. Advised patient we will call her with other STD results. Return precautions given    Allegra Granarnett, F[place] Gasner G, FNP 12/27/16 1702

## 2017-01-01 ENCOUNTER — Telehealth: Payer: Self-pay | Admitting: *Deleted

## 2017-01-01 ENCOUNTER — Telehealth: Payer: Self-pay

## 2017-01-01 NOTE — Telephone Encounter (Signed)
Medication refill has been called in and message left on patient's voicemail

## 2017-01-01 NOTE — Telephone Encounter (Signed)
Please notify patient that usually we do not replace medication list loss: Or fallen down with drains or the toilet but I'll make an exception course and you we will call in #16 one tablet twice a day

## 2017-01-01 NOTE — Telephone Encounter (Signed)
Patient calling requesting medication refill on Metronidazole (Flagyl) 500 mg due to all but 3 falling down the drain on Monday. She states she did not take any medications over the weekend due to she knew she would be drinking. She started taking the medication on Monday.   I advised I would send her request to a provider and she would receive a call from the clinical staff once a decision has been made. She stated she understood.

## 2017-01-12 ENCOUNTER — Ambulatory Visit
Admission: EM | Admit: 2017-01-12 | Discharge: 2017-01-12 | Disposition: A | Discharge status: Home / Self Care | Payer: Medicaid Other | Attending: Family Medicine | Admitting: Family Medicine

## 2017-01-12 DIAGNOSIS — Z800 Family history of malignant neoplasm of digestive organs: Secondary | ICD-10-CM | POA: Diagnosis not present

## 2017-01-12 DIAGNOSIS — Z8742 Personal history of other diseases of the female genital tract: Secondary | ICD-10-CM | POA: Diagnosis not present

## 2017-01-12 DIAGNOSIS — Z825 Family history of asthma and other chronic lower respiratory diseases: Secondary | ICD-10-CM | POA: Insufficient documentation

## 2017-01-12 DIAGNOSIS — E11319 Type 2 diabetes mellitus with unspecified diabetic retinopathy without macular edema: Secondary | ICD-10-CM | POA: Diagnosis not present

## 2017-01-12 DIAGNOSIS — I10 Essential (primary) hypertension: Secondary | ICD-10-CM | POA: Insufficient documentation

## 2017-01-12 DIAGNOSIS — J45909 Unspecified asthma, uncomplicated: Secondary | ICD-10-CM | POA: Insufficient documentation

## 2017-01-12 DIAGNOSIS — Z8572 Personal history of non-Hodgkin lymphomas: Secondary | ICD-10-CM | POA: Insufficient documentation

## 2017-01-12 DIAGNOSIS — Z833 Family history of diabetes mellitus: Secondary | ICD-10-CM | POA: Diagnosis not present

## 2017-01-12 DIAGNOSIS — N898 Other specified noninflammatory disorders of vagina: Secondary | ICD-10-CM

## 2017-01-12 DIAGNOSIS — Z794 Long term (current) use of insulin: Secondary | ICD-10-CM | POA: Insufficient documentation

## 2017-01-12 DIAGNOSIS — H409 Unspecified glaucoma: Secondary | ICD-10-CM | POA: Insufficient documentation

## 2017-01-12 DIAGNOSIS — Z09 Encounter for follow-up examination after completed treatment for conditions other than malignant neoplasm: Secondary | ICD-10-CM | POA: Diagnosis not present

## 2017-01-12 DIAGNOSIS — Z79899 Other long term (current) drug therapy: Secondary | ICD-10-CM | POA: Insufficient documentation

## 2017-01-12 DIAGNOSIS — B9689 Other specified bacterial agents as the cause of diseases classified elsewhere: Secondary | ICD-10-CM | POA: Diagnosis not present

## 2017-01-12 DIAGNOSIS — N760 Acute vaginitis: Secondary | ICD-10-CM

## 2017-01-12 LAB — WET PREP, GENITAL
CLUE CELLS WET PREP: NONE SEEN
Sperm: NONE SEEN
Trich, Wet Prep: NONE SEEN
Yeast Wet Prep HPF POC: NONE SEEN

## 2017-01-12 MED ORDER — ACETAMINOPHEN 500 MG PO TABS
1000.0000 mg | ORAL_TABLET | Freq: Once | ORAL | Status: AC
  Administered 2017-01-12: 1000 mg via ORAL

## 2017-01-12 NOTE — ED Triage Notes (Signed)
Patient states that she was seen here on 12/27/2016 and was treated for bacterial vaginosis. Patient states that she was treated and has been symptom free. Patient would like to be rechecked today to make sure that she is clear.

## 2017-01-12 NOTE — ED Provider Notes (Signed)
[facility]    CSN: [id number] Arrival date & time: 01/12/17  1137     History   Chief Complaint Chief Complaint  Patient presents with  . Vaginitis    HPI Cassandra Black is a 46 y.o. female.   46 yo female with a recent history of bacterial vaginosis several weeks ago, here requesting recheck. States her symptoms improved and she finished her prescribed course of antibiotic.    The history is provided by the patient.    Past Medical History:  Diagnosis Date  . Anemia   . Asthma   . Cataract   . Diabetes mellitus without complication (HCC)   . Diabetic retinopathy of both eyes (HCC)   . Glaucoma   . HSV infection   . Hypertension   . Lymphoma (HCC) 07-13-15   liver/spleen    There are no active problems to display for this patient.   History reviewed. No pertinent surgical history.  OB History    Gravida Para Term Preterm AB Living   3 1     2      SAB TAB Ectopic Multiple Live Births   2              Obstetric Comments   1st Menstrual Cycle:  15 1st Pregnancy:  26        Home Medications    Prior to Admission medications   Medication Sig Start Date End Date Taking? Authorizing Provider  insulin aspart (NOVOLOG) 100 UNIT/ML FlexPen Inject 22 Units into the skin 3 (three) times daily. 07/10/15  Yes [provider]  Insulin Glargine (LANTUS SOLOSTAR) 100 UNIT/ML Solostar Pen Inject 45 Units into the skin Nightly. 07/10/15  Yes [provider]  Lancets 28G MISC  02/02/14  Yes [provider]  levonorgestrel (MIRENA) 20 MCG/24HR IUD 1 each by Intrauterine route once.   Yes [provider]  lisinopril (PRINIVIL,ZESTRIL) 5 MG tablet Take 5 mg by mouth daily. 06/01/15 01/12/17 Yes [provider]  metFORMIN (GLUCOPHAGE-XR) 500 MG 24 hr tablet Take 500 mg by mouth daily. 06/01/15 01/12/17 Yes [provider]  triamterene-hydrochlorothiazide (MAXZIDE-25) 37.5-25 MG tablet Take 1 tablet by mouth daily.    Yes [provider]  valACYclovir (VALTREX) 500 MG tablet Take 500 mg by mouth 2 (two) times daily.   Yes [provider]  albuterol (PROAIR HFA) 108 (90 Base) MCG/ACT inhaler Inhale 90 mcg into the lungs as needed. 06/01/15 05/31/16  [provider]  carvedilol (COREG) 3.125 MG tablet Take 3.125 mg by mouth 2 (two) times daily. 07/17/15 07/16/16  [provider]  cyclobenzaprine (FLEXERIL) 10 MG tablet Take 1 tablet (10 mg total) by mouth 3 (three) times daily as needed for muscle spasms. 12/12/15   Ignacia Bayley, PA-C  doxycycline (VIBRA-TABS) 100 MG tablet Take 1 tablet (100 mg total) by mouth 2 (two) times daily. 08/01/15   Loann Quill, NP  HYDROcodone-acetaminophen (NORCO) 5-325 MG tablet Take 1 tablet by mouth every 4 (four) hours as needed for moderate pain. 11/29/16   Evon Slack, PA-C  ibuprofen (ADVIL,MOTRIN) 800 MG tablet Take 1 tablet (800 mg total) by mouth every 8 (eight) hours as needed. 12/12/15   Ignacia Bayley, PA-C  metroNIDAZOLE (FLAGYL) 500 MG tablet Take 1 tablet (500 mg total) by mouth 2 (two) times daily. 12/27/16   Allegra Grana, FNP  oxyCODONE-acetaminophen (ROXICET) 5-325 MG tablet Take 1 tablet by mouth every 6 (six) hours as needed. 12/12/15   Tumey,  Molly Maduroobert, PA-C  prochlorperazine (COMPAZINE) 10 MG tablet Take 1 tablet (10 mg total) by mouth every 6 (six) hours as needed for nausea or vomiting. 08/03/15   Loann QuillHerring, Leslie F, NP  sulfamethoxazole-trimethoprim (BACTRIM DS,SEPTRA DS) 800-160 MG tablet Take 1 tablet by mouth 2 (two) times daily. 11/29/16   Evon SlackGaines, Thomas C, PA-C    Family History Family History  Problem Relation Age of Onset  . COPD Father   . Diabetes Mother   . Cancer Mother        pancreatic    Social History Social History  Substance Use Topics  . Smoking status: Never Smoker  . Smokeless tobacco: Never Used  . Alcohol use Yes     Comment: occasionally     Allergies   Patient has no known  allergies.   Review of Systems Review of Systems   Physical Exam Triage Vital Signs ED Triage Vitals  Enc Vitals Group     BP 01/12/17 1217 (!) 149/92     Pulse Rate 01/12/17 1217 92     Resp 01/12/17 1217 18     Temp 01/12/17 1217 98.1 F (36.7 C)     Temp Source 01/12/17 1217 Oral     SpO2 01/12/17 1217 100 %     Weight 01/12/17 1215 189 lb (85.7 kg)     Height 01/12/17 1215 5\' 5"  (1.651 m)     Head Circumference --      Peak Flow --      Pain Score 01/12/17 1215 0     Pain Loc --      Pain Edu? --      Excl. in GC? --    No data found.   Updated Vital Signs BP (!) 149/92 (BP Location: Left Arm)   Pulse 92   Temp 98.1 F (36.7 C) (Oral)   Resp 18   Ht 5\' 5"  (1.651 m)   Wt 189 lb (85.7 kg)   LMP 12/29/2016   SpO2 100%   BMI 31.45 kg/m   Visual Acuity Right Eye Distance:   Left Eye Distance:   Bilateral Distance:    Right Eye Near:   Left Eye Near:    Bilateral Near:     Physical Exam  Constitutional: She appears well-developed and well-nourished. No distress.  Genitourinary: Pelvic exam was performed with patient supine. There is no rash, tenderness or lesion on the right labia. There is no rash, tenderness or lesion on the left labia. Cervix exhibits no discharge and no friability. No tenderness in the vagina. No foreign body in the vagina. No signs of injury around the vagina. Vaginal discharge (mild) found.  Skin: She is not diaphoretic.  Nursing note and vitals reviewed.    UC Treatments / Results  Labs (all labs ordered are listed, but only abnormal results are displayed) Labs Reviewed  WET PREP, GENITAL - Abnormal; Notable for the following:       Result Value   WBC, Wet Prep HPF POC FEW (*)    All other components within normal limits    EKG  EKG Interpretation None       Radiology No results found.  Procedures Procedures (including critical care time)  Medications Ordered in UC Medications  acetaminophen (TYLENOL) tablet  1,000 mg (1,000 mg Oral Given 01/12/17 1258)     Initial Impression / Assessment and Plan / UC Course  I have reviewed the triage vital signs and the nursing notes.  Pertinent labs & imaging results  that were available during my care of the patient were reviewed by me and considered in my medical decision making (see chart for details).       Final Clinical Impressions(s) / UC Diagnoses   Final diagnoses:  Bacterial vaginosis  (resolved)  New Prescriptions Discharge Medication List as of 01/12/2017  1:21 PM     1. Lab results and diagnosis reviewed with patient 2. Follow-up prn  Controlled Substance Prescriptions [place] Controlled Substance Registry consulted? Not Applicable   Payton Mccallum, MD 01/12/17 [phone]

## 2017-02-04 ENCOUNTER — Telehealth: Payer: Self-pay

## 2017-02-04 NOTE — Telephone Encounter (Signed)
Pt calling for appt, has knots in vulva. Has hx of herpes but it was diff.  Has been on valtrex for two years.  [phone]

## 2017-02-05 NOTE — Telephone Encounter (Signed)
Pt is reeschedule due to cancellation with [facility] 02/09/17

## 2017-02-05 NOTE — Telephone Encounter (Signed)
Called due to cancellation wait list on Dr. Jean RosenthalJackson. Left voicemail for patient to call to schedule

## 2017-02-08 NOTE — Progress Notes (Addendum)
Gynecology Annual Exam  PCP: Titus MouldWhite, Elizabeth Burney, NP  Chief Complaint: There is no family history of breast or ovarian cancer.  Chief Complaint  Patient presents with  . Gynecologic Exam    History of Present Illness "Cassandra Black"  is a 46 year old African American/Black female G3 P1112, who presents for an annual exam Her last menstrual period was on 02/08/2017 and is continuing today. Her periods occur approximately every 1 month, last 5-7 days, with 1-2 heavy days with clots requiring pad changes twice during the day.  Her bleeding has been much better after the insertion of her Mirena in 06/15/2014. She had a workup for her menorrhagia by Dr Jean RosenthalJackson which included a US, endometrial biopsy and SIS. Ultrasound was consistent with adenomyosis and biopsy returned B9  She reports recent problems with her boyfriend getting irritated after they have intercourse. She was seen twice in the last 3 months for STD testing and has been treated for BV.She was also treated for a Bartholin's abscess in June. She has some mild itching currently and some bumps on her vulva. She has a history of hydradenitis of her vulva, mostly on her left mons and left groin, which are currently draining. She also was diagnosed with HSVII 2 years ago and she takes Valtrex daily for PPX (500 mgm BID).   Her past medical history is notable for uncontrolled type 2 diabetes (blood sugars in the 400s in Feb 2018 and hemoglobin A1C was 14+%), hypertension, lower extremity edema, anemia (last hematocrit 34% on 07/2015)..  Her last annual/ Pap smear was about 3 years ago (none found). Pap smear was normal per patient report. No hx of abnormal Pap smears. She is sexually active and in aM/M relationship with a man x 2 years. Mirena is her current form of contraception. She has not had a recent mammogram and is eligible. She is interested in breast reduction surgery due to lines in her shoulders from her shoulder straps. Wears D  cup There is no history of breast or ovarian cancer in her family. She does do monthly self breast exams She does not smoke. (Is a former smoker) She drinks socially on the weekends. She does not use street drugs (Used MJ formerly) She does exercise occasionally She does not get enough calcium in her diet.  Patient had a recent cholesterol screen in 2018 and was normal.    The patient denies current symptoms of depression.    Review of Systems: Review of Systems  Constitutional: Negative for chills, fever and weight loss.  HENT: Negative for congestion, sinus pain and sore throat.   Eyes: Negative for blurred vision and pain.  Respiratory: Negative for hemoptysis, shortness of breath and wheezing.   Cardiovascular: Negative for chest pain, palpitations and leg swelling.  Gastrointestinal: Negative for abdominal pain, blood in stool, diarrhea, heartburn, nausea and vomiting.  Genitourinary: Negative for dysuria, frequency, hematuria and urgency.       See HPI  Musculoskeletal: Negative for back pain, joint pain and myalgias.       Positive for shoulder pain  Skin: Positive for itching (on vulva).       Positive for draining boils on mons  Neurological: Negative for dizziness, tingling and headaches.  Endo/Heme/Allergies: Negative for environmental allergies and polydipsia. Does not bruise/bleed easily.       Negative for hirsutism   Psychiatric/Behavioral: Negative for depression. The patient is not nervous/anxious and does not have insomnia.     Past Medical  History:  Past Medical History:  Diagnosis Date  . Acne   . Anemia   . Asthma   . Cataract   . Diabetic retinopathy of both eyes (HCC)   . Glaucoma   . HSV infection   . Hydradenitis   . Hypertension   . 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 Uncontrolled diabetes mellitus (HCC)     Past Surgical History:  Past Surgical History:  Procedure Laterality Date  . CESAREAN SECTION  1999/ 2010   Twins in 1999 (23  weeks)/ repeat CS in 2010  . EYE SURGERY     laser    Family History:  Family History  Problem Relation Age of Onset  . COPD Father   . Diabetes Mother   . Cancer Mother 50       pancreatic  . Diabetes Sister   . Testicular cancer Son 8       Rhabdomyosarcoma    Social History:  Social History   Social History  . Marital status: Divorced    Spouse name: N/A  . Number of children: 2  . Years of education: N/A   Occupational History  . [employer]    Social History Main Topics  . Smoking status: Former Games developer  . Smokeless tobacco: Never Used  . Alcohol use Yes     Comment: occasionally  . Drug use: No  . Sexual activity: Yes    Partners: Male    Birth control/ protection: IUD   Other Topics Concern  . Not on file   Social History Narrative  . No narrative on file    Allergies:  No Known Allergies  Medications:  Current Outpatient Prescriptions on File Prior to Visit  Medication Sig Dispense Refill  . insulin aspart (NOVOLOG) 100 UNIT/ML FlexPen Inject 22 Units into the skin 3 (three) times daily.    . Insulin Glargine (LANTUS SOLOSTAR) 100 UNIT/ML Solostar Pen Inject 45 Units into the skin Nightly.    . Lancets 28G MISC     . levonorgestrel (MIRENA) 20 MCG/24HR IUD 1 each by Intrauterine route once.    . triamterene-hydrochlorothiazide (MAXZIDE-25) 37.5-25 MG tablet Take 1 tablet by mouth daily.    . valACYclovir (VALTREX) 500 MG tablet Take 500 mg by mouth 2 (two) times daily.    Marland Kitchen lisinopril (PRINIVIL,ZESTRIL) 5 MG tablet Take 5 mg by mouth daily.    . metFORMIN (GLUCOPHAGE-XR) 500 MG 24 hr tablet Take 500 mg by mouth daily.     No current facility-administered medications on file prior to visit.       Physical Exam Vitals: BP 140/80   Pulse 94   Ht  (1.651 m)   Wt 191 lb (86.6 kg)   LMP 02/08/2017 (Exact Date)   BMI 31.78 kg/m   General: BF in NAD HEENT: normocephalic, anicteric Neck: no thyroid enlargement, no palpable  nodules, no cervical lymphadenopathy  Pulmonary: No increased work of breathing, CTAB Cardiovascular: RRR, without murmur  Breast: Breast ptotic, no tenderness, no palpable nodules or masses, no skin or nipple retraction present, no nipple discharge.  No axillary, infraclavicular or supraclavicular lymphadenopathy. Abdomen: Soft, non-tender, non-distended.  Umbilicus without lesions.  No hepatomegaly or masses palpable. No evidence of hernia. Genitourinary:  External: Labia majora excoriated with a white plaque present.  Several draining boils on left mons and near left groin. Normal urethral meatus, normal Bartholin's and Skene's glands.    Vagina: Normal vaginal mucosa, no evidence of prolapse, small  amount of blood in vault.    Cervix: Grossly normal in appearance, IUD strings present, non-tender  Uterus: Midplane, globular,  8 week size, decreased mobility, and non-tender  Adnexa: No adnexal masses, non-tender  Rectal: deferred  Lymphatic: no evidence of inguinal lymphadenopathy Extremities: no erythema, or tenderness Neurologic: Grossly intact Psychiatric: mood appropriate, affect full Results for orders placed or performed in visit on 02/09/17 (from the past 24 hour(s))  POCT Wet Prep Mellody [place])     Status: Normal   Collection Time: 02/09/17  9:25 PM  Result Value Ref Range   Source Wet Prep POC vagina    WBC, Wet Prep HPF POC     Bacteria Wet Prep HPF POC  Few   BACTERIA WET PREP MORPHOLOGY POC     Clue Cells Wet Prep HPF POC None None   Clue Cells Wet Prep Whiff POC     Yeast Wet Prep HPF POC None    KOH Wet Prep POC     Trichomonas Wet Prep HPF POC Absent Absent       Assessment: 46 y.o. [id number] for annual gyn exam Vulvitis IUD in place Hx of menorrhagia- improved with Mirena  Plan:  1) Breast cancer screening - recommend monthly self breast exam and annual mammograms Mammogram was ordered today. Patient to schedule in [place]  2) STI screening was offered and  declined. Wet prep was negative, but due to menses may not have been adequate specimen. IN light of diabetes and recent antibiotic usage, will treat for monilial infection with Diflucan and Mycolog. If no improvement in appearance, will biopsy vulva. FU in 1 mnth  3) Cervical cancer screening - Pap was done.   4) Contraception - Mirena intact.   5) Routine healthcare maintenance including cholesterol screening managed by PCP

## 2017-02-09 ENCOUNTER — Ambulatory Visit (INDEPENDENT_AMBULATORY_CARE_PROVIDER_SITE_OTHER): Payer: Medicaid Other | Admitting: Certified Nurse Midwife

## 2017-02-09 ENCOUNTER — Encounter: Payer: Self-pay | Admitting: Certified Nurse Midwife

## 2017-02-09 VITALS — BP 140/80 | HR 94 | Ht 65.0 in | Wt 191.0 lb

## 2017-02-09 DIAGNOSIS — Z8742 Personal history of other diseases of the female genital tract: Secondary | ICD-10-CM | POA: Diagnosis not present

## 2017-02-09 DIAGNOSIS — E1165 Type 2 diabetes mellitus with hyperglycemia: Secondary | ICD-10-CM | POA: Insufficient documentation

## 2017-02-09 DIAGNOSIS — L732 Hidradenitis suppurativa: Secondary | ICD-10-CM | POA: Diagnosis not present

## 2017-02-09 DIAGNOSIS — IMO0002 Reserved for concepts with insufficient information to code with codable children: Secondary | ICD-10-CM | POA: Insufficient documentation

## 2017-02-09 DIAGNOSIS — Z1231 Encounter for screening mammogram for malignant neoplasm of breast: Secondary | ICD-10-CM | POA: Diagnosis not present

## 2017-02-09 DIAGNOSIS — Z01419 Encounter for gynecological examination (general) (routine) without abnormal findings: Secondary | ICD-10-CM

## 2017-02-09 DIAGNOSIS — Z0000 Encounter for general adult medical examination without abnormal findings: Secondary | ICD-10-CM | POA: Diagnosis not present

## 2017-02-09 DIAGNOSIS — Z1239 Encounter for other screening for malignant neoplasm of breast: Secondary | ICD-10-CM

## 2017-02-09 DIAGNOSIS — Z124 Encounter for screening for malignant neoplasm of cervix: Secondary | ICD-10-CM | POA: Diagnosis not present

## 2017-02-09 DIAGNOSIS — N9089 Other specified noninflammatory disorders of vulva and perineum: Secondary | ICD-10-CM

## 2017-02-09 DIAGNOSIS — H409 Unspecified glaucoma: Secondary | ICD-10-CM | POA: Insufficient documentation

## 2017-02-09 DIAGNOSIS — A6000 Herpesviral infection of urogenital system, unspecified: Secondary | ICD-10-CM | POA: Insufficient documentation

## 2017-02-09 DIAGNOSIS — N762 Acute vulvitis: Secondary | ICD-10-CM

## 2017-02-09 DIAGNOSIS — N920 Excessive and frequent menstruation with regular cycle: Secondary | ICD-10-CM | POA: Insufficient documentation

## 2017-02-09 DIAGNOSIS — I10 Essential (primary) hypertension: Secondary | ICD-10-CM | POA: Insufficient documentation

## 2017-02-09 DIAGNOSIS — D649 Anemia, unspecified: Secondary | ICD-10-CM | POA: Insufficient documentation

## 2017-02-09 DIAGNOSIS — J45909 Unspecified asthma, uncomplicated: Secondary | ICD-10-CM | POA: Insufficient documentation

## 2017-02-09 LAB — POCT WET PREP (WET MOUNT): Trichomonas Wet Prep HPF POC: ABSENT

## 2017-02-09 MED ORDER — NYSTATIN 100000 UNIT/GM EX OINT: 1.0000 "application " | TOPICAL_OINTMENT | Freq: Two times a day (BID) | CUTANEOUS | 0 refills | Status: DC

## 2017-02-09 MED ORDER — FLUCONAZOLE 150 MG PO TABS: ORAL_TABLET | ORAL | 0 refills | Status: DC

## 2017-02-09 MED ORDER — TRIAMCINOLONE ACETONIDE 0.1 % EX OINT: 1.0000 "application " | TOPICAL_OINTMENT | Freq: Two times a day (BID) | CUTANEOUS | 0 refills | Status: DC

## 2017-02-11 LAB — IGP, APTIMA HPV
HPV APTIMA: NEGATIVE
PAP SMEAR COMMENT: 0

## 2017-02-16 ENCOUNTER — Ambulatory Visit: Payer: Self-pay | Admitting: Obstetrics and Gynecology

## 2017-02-24 ENCOUNTER — Ambulatory Visit (INDEPENDENT_AMBULATORY_CARE_PROVIDER_SITE_OTHER): Payer: Medicaid Other | Admitting: Advanced Practice Midwife

## 2017-02-24 ENCOUNTER — Encounter: Payer: Self-pay | Admitting: Advanced Practice Midwife

## 2017-02-24 VITALS — BP 128/74 | Ht 65.0 in | Wt 190.0 lb

## 2017-02-24 DIAGNOSIS — Z7689 Persons encountering health services in other specified circumstances: Secondary | ICD-10-CM

## 2017-02-24 DIAGNOSIS — E119 Type 2 diabetes mellitus without complications: Secondary | ICD-10-CM | POA: Insufficient documentation

## 2017-02-24 NOTE — Progress Notes (Signed)
S: The patient is here today to make sure that she does not have any vaginal infections or HSV outbreaks. She and her boyfriend have been having unprotected intercourse recently and he has complained of a tingling feeling during intercourse. He is concerned that she may have some vaginitis or outbreak of HSV that she may be passing along to him. She has suggested that he go to his doctor for evaluation of symptoms. She denies any symptoms of itching, irritation, burning, or discharge. She was seen here recently for similar concerns with no problems noted. She takes a daily suppressive dose of valtrex and also wonders if there is a risk of passing along HSV by oral sex. She denies any outbreaks of HSV since she has been on the daily suppression. She also has a concern about a bump on her left buttocks that is not painful and wonders if it could be lanced. The patient has insulin and oral medication controlled diabetes and admits to rarely checking her blood sugar. She is afraid that it is in the 400 range. Discussion of the likelihood of HSV transmission given daily dosing of valtrex and the importance of checking blood sugar regularly, eating healthy, increasing activity stressed.   O: Vital Signs: BP 128/74   Ht  (1.651 m)   Wt 190 lb (86.2 kg)   LMP 02/08/2017 (Exact Date)   BMI 31.62 kg/m  Constitutional: Well nourished, well developed female in no acute distress.  HEENT: normal Skin: Warm and dry.  Cardiovascular: Regular rate and rhythm.   Respiratory: Clear to auscultation bilateral. Normal respiratory effort Psych: Alert and Oriented x3. No memory deficits. Normal mood and affect.  MS: normal gait, normal bilateral lower extremity ROM/strength/stability.  Pelvic exam:  is not limited by body habitus EGBUS: within normal limits Vagina: within normal limits and with normal mucosa, minimal discharge Cervix: not evaluated  Wet prep: negative for yeast, clue cells, trichomonas Spot on  buttocks is 1.5 cm x 1.5 cm, circular, mildly hard and elevated to palpation, non-tender to palpation, no redness or inflammation. Recommended hot compresses at this time rather than lancing.   A: 46 yo female with concerns of vaginal infection  P: Reassurance given of normal  Recommended continue on current daily dosing of valtrex Recommended see endocrinologist and regularly check blood sugar Increase healthy lifestyle diet and exercise Sea salt bath for cleansing/healing  OTC boric acid for vaginal pH  Tresea Mall, CNM

## 2017-03-18 ENCOUNTER — Telehealth: Payer: Self-pay | Admitting: Certified Nurse Midwife

## 2017-03-18 NOTE — Telephone Encounter (Signed)
Contacted patient to f/u on prior imaging needed for her mammo. Patient said she still doesn't know which imaging facility she used in [place], but will work on that tomorrow. Patient also said she was going to call our office, that she's had a thick, white discharge and fishy odor the last 4-5 days, and would like to know whether something could be called in or if she needs another appt.

## 2017-03-18 NOTE — Telephone Encounter (Signed)
Pt has not been seen since 9/15. She needs an appointment or she can go to PCP/Urgent care. This does not have to be a work-in just next available.

## 2017-03-19 ENCOUNTER — Ambulatory Visit
Admission: EM | Admit: 2017-03-19 | Discharge: 2017-03-19 | Disposition: A | Discharge status: Home / Self Care | Payer: Medicaid Other | Attending: Family Medicine | Admitting: Family Medicine

## 2017-03-19 DIAGNOSIS — Z79899 Other long term (current) drug therapy: Secondary | ICD-10-CM | POA: Insufficient documentation

## 2017-03-19 DIAGNOSIS — N920 Excessive and frequent menstruation with regular cycle: Secondary | ICD-10-CM | POA: Diagnosis not present

## 2017-03-19 DIAGNOSIS — N3000 Acute cystitis without hematuria: Secondary | ICD-10-CM | POA: Insufficient documentation

## 2017-03-19 DIAGNOSIS — H409 Unspecified glaucoma: Secondary | ICD-10-CM | POA: Diagnosis not present

## 2017-03-19 DIAGNOSIS — E1165 Type 2 diabetes mellitus with hyperglycemia: Secondary | ICD-10-CM | POA: Diagnosis not present

## 2017-03-19 DIAGNOSIS — E782 Mixed hyperlipidemia: Secondary | ICD-10-CM | POA: Insufficient documentation

## 2017-03-19 DIAGNOSIS — D649 Anemia, unspecified: Secondary | ICD-10-CM | POA: Diagnosis not present

## 2017-03-19 DIAGNOSIS — Z87891 Personal history of nicotine dependence: Secondary | ICD-10-CM | POA: Diagnosis not present

## 2017-03-19 DIAGNOSIS — N760 Acute vaginitis: Secondary | ICD-10-CM | POA: Diagnosis not present

## 2017-03-19 DIAGNOSIS — R300 Dysuria: Secondary | ICD-10-CM | POA: Diagnosis present

## 2017-03-19 DIAGNOSIS — B9689 Other specified bacterial agents as the cause of diseases classified elsewhere: Secondary | ICD-10-CM | POA: Diagnosis not present

## 2017-03-19 DIAGNOSIS — Z794 Long term (current) use of insulin: Secondary | ICD-10-CM | POA: Insufficient documentation

## 2017-03-19 DIAGNOSIS — J45909 Unspecified asthma, uncomplicated: Secondary | ICD-10-CM | POA: Insufficient documentation

## 2017-03-19 DIAGNOSIS — N898 Other specified noninflammatory disorders of vagina: Secondary | ICD-10-CM | POA: Diagnosis present

## 2017-03-19 DIAGNOSIS — E113512 Type 2 diabetes mellitus with proliferative diabetic retinopathy with macular edema, left eye: Secondary | ICD-10-CM | POA: Diagnosis not present

## 2017-03-19 LAB — URINALYSIS, COMPLETE (UACMP) WITH MICROSCOPIC
BILIRUBIN URINE: NEGATIVE
Glucose, UA: 500 mg/dL — AB
KETONES UR: NEGATIVE mg/dL
NITRITE: POSITIVE — AB
Specific Gravity, Urine: 1.015 (ref 1.005–1.030)
pH: 5.5 (ref 5.0–8.0)

## 2017-03-19 LAB — WET PREP, GENITAL
Sperm: NONE SEEN
Trich, Wet Prep: NONE SEEN
Yeast Wet Prep HPF POC: NONE SEEN

## 2017-03-19 LAB — CHLAMYDIA/NGC RT PCR (ARMC ONLY)
Chlamydia Tr: NOT DETECTED
N gonorrhoeae: NOT DETECTED

## 2017-03-19 MED ORDER — NITROFURANTOIN MONOHYD MACRO 100 MG PO CAPS: 100.0000 mg | ORAL_CAPSULE | Freq: Two times a day (BID) | ORAL | 0 refills | Status: DC

## 2017-03-19 MED ORDER — METRONIDAZOLE 500 MG PO TABS: 500.0000 mg | ORAL_TABLET | Freq: Two times a day (BID) | ORAL | 0 refills | Status: DC

## 2017-03-19 MED ORDER — FLUCONAZOLE 150 MG PO TABS: ORAL_TABLET | ORAL | 0 refills | Status: DC

## 2017-03-19 NOTE — ED Triage Notes (Signed)
Patient complains of painful urination, urinary frequency. Patient states that she has been having a thick white discharge with a fishy odor. Patient states that she would like to be checked for BV and Trichomonas.

## 2017-03-19 NOTE — ED Provider Notes (Signed)
[facility]    CSN: [id number] Arrival date & time: 03/19/17  1755     History   Chief Complaint Chief Complaint  Patient presents with  . Dysuria    HPI Cassandra Black is a 46 y.o. female.   46 yr old aa female presents to UC with several day hx of dysuria, vaginal discharge, foul odor, same partner x 2 years, distant hx of STI, pt has been seen at this facility and GYN repeatedly for similar s/s. No abx over 1 month   The history is provided by the patient. No language interpreter was used.    Past Medical History:  Diagnosis Date  . Acne   . Anemia   . Asthma   . Cataract   . Diabetic retinopathy of both eyes (HCC)   . Glaucoma   . HSV infection   . Hydradenitis   . Hypertension   . 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 Uncontrolled diabetes mellitus [facility](HCC)     Patient Active Problem List   Diagnosis Date Noted  . Acute cystitis without hematuria 03/19/2017  . BV (bacterial vaginosis) 03/19/2017  . Diabetes mellitus type 2, uncomplicated (HCC) 02/24/2017  . Menorrhagia 02/09/2017  . Hypertension   . Herpes genitalis   . Glaucoma   . Uncontrolled diabetes mellitus (HCC)   . Asthma   . Anemia   . Hydradenitis   . 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 Mixed hyperlipidemia 07/17/2015  . Acute systolic CHF (congestive heart failure) (HCC) 07/17/2015  . 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 Diabetic macular edema (HCC) 08/31/2014    Past Surgical History:  Procedure Laterality Date  . CESAREAN SECTION  1999/ 2010   Twins in 1999 (23 weeks)/ repeat CS in 2010  . EYE SURGERY     laser    OB History    Gravida Para Term Preterm AB Living   3 2 1 1 1 2    SAB TAB Ectopic Multiple Live Births   1     1 2       Obstetric Comments   1st Menstrual Cycle:  15 1st Pregnancy:  26        Home Medications    Prior to Admission medications   Medication Sig Start Date End Date  Taking? Authorizing Provider  [employer] WITH PUMP gel USE 1(ONE) APPLICATION(S) TOPICAL EVERY DAY 01/08/17  Yes [provider]  insulin aspart (NOVOLOG) 100 UNIT/ML FlexPen Inject 22 Units into the skin 3 (three) times daily. 07/10/15  Yes [provider]  Insulin Glargine (LANTUS SOLOSTAR) 100 UNIT/ML Solostar Pen Inject 45 Units into the skin Nightly. 07/10/15  Yes [provider]  Lancets 28G MISC  02/02/14  Yes [provider]  levonorgestrel (MIRENA) 20 MCG/24HR IUD 1 each by Intrauterine route once.   Yes [provider]  lisinopril (PRINIVIL,ZESTRIL) 5 MG tablet Take 5 mg by mouth daily. 06/01/15 03/19/17 Yes [provider]  metFORMIN (GLUCOPHAGE-XR) 500 MG 24 hr tablet Take 500 mg by mouth daily. 06/01/15 03/19/17 Yes [provider]  triamterene-hydrochlorothiazide (MAXZIDE-25) 37.5-25 MG tablet Take 1 tablet by mouth daily.   Yes [provider]  valACYclovir (VALTREX) 500 MG tablet Take 500 mg by mouth 2 (two) times daily.   Yes [provider]  fluconazole (DIFLUCAN) 150 MG tablet Take one tablet every three days x 2 doses 03/19/17   Ilamae Geng, Para MarchJeanette, NP  metroNIDAZOLE (FLAGYL) 500 MG  tablet Take 1 tablet (500 mg total) by mouth 2 (two) times daily. 03/19/17   Caidence Higashi, Para March, NP  nitrofurantoin, macrocrystal-monohydrate, (MACROBID) 100 MG capsule Take 1 capsule (100 mg total) by mouth 2 (two) times daily. 03/19/17   Malon Branton, Para March, NP  nystatin ointment (MYCOSTATIN) Apply 1 application topically 2 (two) times daily. 02/09/17   Farrel Conners, CNM  triamcinolone ointment (KENALOG) 0.1 % Apply 1 application topically 2 (two) times daily. 02/09/17   Farrel Conners, CNM    Family History Family History  Problem Relation Age of Onset  . COPD Father   . Diabetes Mother   . Cancer Mother 49       pancreatic  . Diabetes Sister   . Testicular cancer Son 8       Rhabdomyosarcoma    Social  History Social History  Substance Use Topics  . Smoking status: Former Games developer  . Smokeless tobacco: Never Used  . Alcohol use Yes     Comment: occasionally     Allergies   Patient has no known allergies.   Review of Systems Review of Systems  Constitutional: Negative for chills and fever.  HENT: Negative for ear pain and sore throat.   Eyes: Negative for pain and visual disturbance.  Respiratory: Negative for cough and shortness of breath.   Cardiovascular: Negative for chest pain and palpitations.  Gastrointestinal: Negative for abdominal pain and vomiting.  Genitourinary: Positive for frequency and vaginal discharge. Negative for dysuria and hematuria.  Musculoskeletal: Negative for arthralgias and back pain.  Skin: Negative for color change and rash.  Neurological: Negative for seizures and syncope.  All other systems reviewed and are negative.    Physical Exam Triage Vital Signs ED Triage Vitals  Enc Vitals Group     BP 03/19/17 1845 (!) 144/85     Pulse Rate 03/19/17 1845 93     Resp 03/19/17 1845 18     Temp 03/19/17 1845 98.3 F (36.8 C)     Temp Source 03/19/17 1845 Oral     SpO2 03/19/17 1845 100 %     Weight 03/19/17 1839 188 lb 9.6 oz (85.5 kg)     Height --      Head Circumference --      Peak Flow --      Pain Score 03/19/17 1842 6     Pain Loc --      Pain Edu? --      Excl. in GC? --    No data found.   Updated Vital Signs BP (!) 144/85 (BP Location: Left Arm)   Pulse 93   Temp 98.3 F (36.8 C) (Oral)   Resp 18   Wt 188 lb 9.6 oz (85.5 kg)   LMP 03/01/2017   SpO2 100%   BMI 31.38 kg/m   Visual Acuity Right Eye Distance:   Left Eye Distance:   Bilateral Distance:    Right Eye Near:   Left Eye Near:    Bilateral Near:     Physical Exam  Constitutional: She is oriented to person, place, and time. Vital signs are normal. She appears well-developed and well-nourished. She is active. No distress.  HENT:  Head: Normocephalic.   Eyes: Pupils are equal, round, and reactive to light.  Neck: Normal range of motion.  Cardiovascular: Normal rate and regular rhythm.   Pulmonary/Chest: Effort normal and breath sounds normal.  Abdominal: Normal appearance and bowel sounds are normal. There is tenderness in the suprapubic area.  Genitourinary: Pelvic exam  was performed with patient supine. Cervix exhibits no motion tenderness, no discharge and no friability. Right adnexum displays no mass, no tenderness and no fullness. Left adnexum displays no mass, no tenderness and no fullness. Vaginal discharge found.  Genitourinary Comments: Chaperoned exam with Melissa, CMA, cx obtained  Musculoskeletal: Normal range of motion.  Neurological: She is alert and oriented to person, place, and time. GCS eye subscore is 4. GCS verbal subscore is 5. GCS motor subscore is 6.  Skin: Skin is warm and dry.  Psychiatric: She has a normal mood and affect. Her speech is normal and behavior is normal.  Nursing note and vitals reviewed.    UC Treatments / Results  Labs (all labs ordered are listed, but only abnormal results are displayed) Labs Reviewed  WET PREP, GENITAL - Abnormal; Notable for the following:       Result Value   Clue Cells Wet Prep HPF POC PRESENT (*)    WBC, Wet Prep HPF POC MODERATE (*)    All other components within normal limits  URINALYSIS, COMPLETE (UACMP) WITH MICROSCOPIC - Abnormal; Notable for the following:    APPearance CLOUDY (*)    Glucose, UA 500 (*)    Hgb urine dipstick SMALL (*)    Protein, ur >300 (*)    Nitrite POSITIVE (*)    Leukocytes, UA SMALL (*)    Squamous Epithelial / LPF 0-5 (*)    Bacteria, UA MANY (*)    All other components within normal limits  URINE CULTURE  CHLAMYDIA/NGC RT PCR ([facility] ONLY)    EKG  EKG Interpretation None       Radiology No results found.  Procedures Procedures (including critical care time)  Medications Ordered in UC Medications - No data to  display   Initial Impression / Assessment and Plan / UC Course  I have reviewed the triage vital signs and the nursing notes.  Pertinent labs & imaging results that were available during my care of the patient were reviewed by me and considered in my medical decision making (see chart for details).      Your urine was positive for UTI, Your wet prep shows BV, + WBC, cultures are pending. Avoid sexual activity until all results and treatments are completed. Condoms with all future sexual activity. No alcohol consumption w meds, drink plenty of water.Return as needed.   Final Clinical Impressions(s) / UC Diagnoses   Final diagnoses:  BV (bacterial vaginosis)  Acute cystitis without hematuria    New Prescriptions Discharge Medication List as of 03/19/2017  8:03 PM    START taking these medications   Details  metroNIDAZOLE (FLAGYL) 500 MG tablet Take 1 tablet (500 mg total) by mouth 2 (two) times daily., Starting Thu 03/19/2017, Print    nitrofurantoin, macrocrystal-monohydrate, (MACROBID) 100 MG capsule Take 1 capsule (100 mg total) by mouth 2 (two) times daily., Starting Thu 03/19/2017, Print         Controlled Substance Prescriptions    Clancy Gourd, NP 03/19/17 2042

## 2017-03-19 NOTE — Discharge Instructions (Signed)
Your urine was positive for UTI, Your wet prep shows BV, + WBC, cultures are pending. Avoid sexual activity until all results and treatments are completed. Condoms with all future sexual activity. No alcohol consumption w meds, drink plenty of water.Return as needed.

## 2017-03-22 LAB — URINE CULTURE

## 2017-03-23 NOTE — Telephone Encounter (Signed)
Called and spoke with patient about scheduling an appointment. Pt stated she followed up with urgent care.

## 2017-03-24 ENCOUNTER — Encounter (HOSPITAL_COMMUNITY): Payer: Self-pay | Admitting: Emergency Medicine

## 2017-04-11 ENCOUNTER — Other Ambulatory Visit: Payer: Self-pay

## 2017-04-11 ENCOUNTER — Encounter: Payer: Self-pay | Admitting: *Deleted

## 2017-04-11 ENCOUNTER — Ambulatory Visit
Admission: EM | Admit: 2017-04-11 | Discharge: 2017-04-11 | Disposition: A | Discharge status: Home / Self Care | Payer: Medicaid Other | Attending: Family Medicine | Admitting: Family Medicine

## 2017-04-11 DIAGNOSIS — L732 Hidradenitis suppurativa: Secondary | ICD-10-CM | POA: Diagnosis not present

## 2017-04-11 DIAGNOSIS — E782 Mixed hyperlipidemia: Secondary | ICD-10-CM | POA: Insufficient documentation

## 2017-04-11 DIAGNOSIS — E1165 Type 2 diabetes mellitus with hyperglycemia: Secondary | ICD-10-CM | POA: Diagnosis not present

## 2017-04-11 DIAGNOSIS — L989 Disorder of the skin and subcutaneous tissue, unspecified: Secondary | ICD-10-CM

## 2017-04-11 DIAGNOSIS — Z79899 Other long term (current) drug therapy: Secondary | ICD-10-CM | POA: Diagnosis not present

## 2017-04-11 DIAGNOSIS — Z794 Long term (current) use of insulin: Secondary | ICD-10-CM | POA: Insufficient documentation

## 2017-04-11 DIAGNOSIS — Z87891 Personal history of nicotine dependence: Secondary | ICD-10-CM | POA: Insufficient documentation

## 2017-04-11 DIAGNOSIS — Z87440 Personal history of urinary (tract) infections: Secondary | ICD-10-CM | POA: Insufficient documentation

## 2017-04-11 DIAGNOSIS — N760 Acute vaginitis: Secondary | ICD-10-CM | POA: Insufficient documentation

## 2017-04-11 DIAGNOSIS — N390 Urinary tract infection, site not specified: Secondary | ICD-10-CM | POA: Diagnosis not present

## 2017-04-11 DIAGNOSIS — E113512 Type 2 diabetes mellitus with proliferative diabetic retinopathy with macular edema, left eye: Secondary | ICD-10-CM | POA: Diagnosis not present

## 2017-04-11 DIAGNOSIS — K651 Peritoneal abscess: Secondary | ICD-10-CM | POA: Diagnosis not present

## 2017-04-11 DIAGNOSIS — B9689 Other specified bacterial agents as the cause of diseases classified elsewhere: Secondary | ICD-10-CM | POA: Diagnosis not present

## 2017-04-11 LAB — URINALYSIS, COMPLETE (UACMP) WITH MICROSCOPIC
BILIRUBIN URINE: NEGATIVE
GLUCOSE, UA: 500 mg/dL — AB
KETONES UR: NEGATIVE mg/dL
LEUKOCYTES UA: NEGATIVE
NITRITE: POSITIVE — AB
PH: 7 (ref 5.0–8.0)
Specific Gravity, Urine: 1.02 (ref 1.005–1.030)

## 2017-04-11 MED ORDER — CEPHALEXIN 500 MG PO CAPS: 500.0000 mg | ORAL_CAPSULE | Freq: Four times a day (QID) | ORAL | 0 refills | Status: AC

## 2017-04-11 NOTE — ED Provider Notes (Signed)
[facility]    CSN: [id number] Arrival date & time: 04/11/17  1417     History   Chief Complaint Chief Complaint  Patient presents with  . Recurrent UTI    HPI Cassandra Black is a 46 y.o. female.   Patient is a [age] year female who presents with concern for recurrent UTI as well as a perineal area abscess. Patient was seen here October 8 and diagnosed with a UTI as well as bacterial vaginosis. She was given a 5 day course of Macrobid. Her urine culture came back with >100,000 CFU's of Escherichia coli. Patient states her symptoms improved at about 3 or 4 days of Macrobid but probably 4-5 days after she completed antibiotics, her urine began to spell again as well.  Patient also reports a abscess in the left perineal area that has been present for a couple weeks was seen last by her OB GEN. That time it was mostly hard but no area of fluctuance that would be amenable to incision and drainage. Patient reports she is also being seen by dermatology for some boil type lesions in her groin area. She reports that she is in the process of getting clearance for Humira treatment for these lesions.  Patient reports she's had lower extremity issues for 4 months that seemed to be just ongoing.      Past Medical History:  Diagnosis Date  . Acne   . Anemia   . Asthma   . Cataract   . Diabetic retinopathy of both eyes (HCC)   . Glaucoma   . HSV infection   . Hydradenitis   . Hypertension   . 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 Uncontrolled diabetes mellitus [facility](HCC)     Patient Active Problem List   Diagnosis Date Noted  . Acute cystitis without hematuria 03/19/2017  . BV (bacterial vaginosis) 03/19/2017  . Diabetes mellitus type 2, uncomplicated (HCC) 02/24/2017  . Menorrhagia 02/09/2017  . Hypertension   . Herpes genitalis   . Glaucoma   . Uncontrolled diabetes mellitus (HCC)   . Asthma   . Anemia   . Hydradenitis   . Proliferative diabetic retinopathy of left  eye associated with type 2 diabetes mellitus (HCC) 10/17/2016  . Mixed hyperlipidemia 07/17/2015  . Acute systolic CHF (congestive heart failure) (HCC) 07/17/2015  . 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 Diabetic macular edema (HCC) 08/31/2014    Past Surgical History:  Procedure Laterality Date  . CESAREAN SECTION  1999/ 2010   Twins in 1999 (23 weeks)/ repeat CS in 2010  . EYE SURGERY     laser    OB History    Gravida Para Term Preterm AB Living   3 2 1 1 1 2    SAB TAB Ectopic Multiple Live Births   1     1 2       Obstetric Comments   1st Menstrual Cycle:  15 1st Pregnancy:  26        Home Medications    Prior to Admission medications   Medication Sig Start Date End Date Taking? Authorizing Provider  insulin aspart (NOVOLOG) 100 UNIT/ML FlexPen Inject 22 Units into the skin 3 (three) times daily. 07/10/15  Yes [provider]  Insulin Glargine (LANTUS SOLOSTAR) 100 UNIT/ML Solostar Pen Inject 45 Units into the skin Nightly. 07/10/15  Yes [provider]  levonorgestrel (MIRENA) 20 MCG/24HR IUD 1 each by Intrauterine route once.   Yes [provider]  lisinopril (PRINIVIL,ZESTRIL) 5 MG  tablet Take 5 mg by mouth daily. 06/01/15 04/11/17 Yes [provider]  metFORMIN (GLUCOPHAGE-XR) 500 MG 24 hr tablet Take 500 mg by mouth daily. 06/01/15 04/11/17 Yes [provider]  triamterene-hydrochlorothiazide (MAXZIDE-25) 37.5-25 MG tablet Take 1 tablet by mouth daily.   Yes [provider]  valACYclovir (VALTREX) 500 MG tablet Take 500 mg by mouth 2 (two) times daily.   Yes [provider]  BENZACLIN WITH PUMP gel USE 1(ONE) APPLICATION(S) TOPICAL EVERY DAY 01/08/17   [provider]  cephALEXin (KEFLEX) 500 MG capsule Take 1 capsule (500 mg total) 4 (four) times daily for 10 days by mouth. 04/11/17 04/21/17  Candis Schatz, PA-C  Lancets 28G MISC  02/02/14   [provider]    Family History Family History    Problem Relation Age of Onset  . COPD Father   . Diabetes Mother   . Cancer Mother 26       pancreatic  . Diabetes Sister   . Testicular cancer Son 8       Rhabdomyosarcoma    Social History Social History   Tobacco Use  . Smoking status: Former Games developer  . Smokeless tobacco: Never Used  Substance Use Topics  . Alcohol use: Yes    Comment: occasionally  . Drug use: No     Allergies   Patient has no known allergies.   Review of Systems Review of Systems  As noted above in history of present illness. Other systems reviewed and found to be negative   Physical Exam Triage Vital Signs ED Triage Vitals  Enc Vitals Group     BP 04/11/17 1457 (!) 174/102     Pulse Rate 04/11/17 1457 (!) 101     Resp 04/11/17 1457 18     Temp 04/11/17 1457 98.5 F (36.9 C)     Temp Source 04/11/17 1457 Oral     SpO2 04/11/17 1457 100 %     Weight 04/11/17 1459 188 lb (85.3 kg)     Height --      Head Circumference --      Peak Flow --      Pain Score 04/11/17 1459 7     Pain Loc --      Pain Edu? --      Excl. in GC? --    No data found.  Updated Vital Signs BP (!) 174/102 (BP Location: Left Arm)   Pulse (!) 101   Temp 98.5 F (36.9 C) (Oral)   Resp 18   Wt 188 lb (85.3 kg)   LMP 04/03/2017   SpO2 100%   BMI 31.28 kg/m   Physical Exam  Constitutional: She appears well-developed and well-nourished. No distress.  HENT:  Head: Normocephalic and atraumatic.  Eyes: EOM are normal. Pupils are equal, round, and reactive to light.  Cardiovascular: Normal rate, regular rhythm and intact distal pulses.  No murmur heard. Pulmonary/Chest: Effort normal and breath sounds normal. No respiratory distress. She has no wheezes.  Abdominal: Soft. Bowel sounds are normal. She exhibits no distension. There is no tenderness. There is no guarding.  Genitourinary:     Skin:        UC Treatments / Results  Labs (all labs ordered are listed, but only abnormal results are  displayed) Labs Reviewed  URINALYSIS, COMPLETE (UACMP) WITH MICROSCOPIC - Abnormal; Notable for the following components:      Result Value   Color, Urine STRAW (*)    Glucose, UA 500 (*)  Hgb urine dipstick TRACE (*)    Protein, ur >300 (*)    Nitrite POSITIVE (*)    Squamous Epithelial / LPF 0-5 (*)    Bacteria, UA MANY (*)    All other components within normal limits    EKG  EKG Interpretation None       Radiology No results found.  Procedures Procedures (including critical care time)  Medications Ordered in UC Medications - No data to display   Initial Impression / Assessment and Plan / UC Course  I have reviewed the triage vital signs and the nursing notes.  Pertinent labs & imaging results that were available during my care of the patient were reviewed by me and considered in my medical decision making (see chart for details).    Patient with complaint of possible abscess on the left perineal area with induration and has been there for several weeks. No fever no fluctuance. It is similar in appearance to the lesion in the right leg/pelvic skin fold. Question of these lesions are more likely related to her hidradenitis and less of a possibility of being abscesses.  Final Clinical Impressions(s) / UC Diagnoses   Final diagnoses:  Skin lesion  Hidradenitis  Recurrent UTI   Patient with recurrence of her UTI. Will give a longer course of Keflex which should cover her UTI as well as a possible abscess in the bacterial vaginosis. Again question if the lesion on the left. The area she's been watching if it is a abscess, or more likely hidradenitis. Patient advised she needs to make sure that all her infectious processes are resolved prior to starting Humira. Pt reports that her dermatologist is aware.  ED Discharge Orders        Ordered    cephALEXin (KEFLEX) 500 MG capsule  4 times daily     04/11/17 1530       Controlled Substance Prescriptions [place]  Controlled Substance Registry consulted? Not Applicable   Candis SchatzHarris, Stepanie Graver D, PA-C 04/11/17 1536

## 2017-04-11 NOTE — ED Triage Notes (Signed)
Patient started having fowl urine smell 1 week ago. Patient also complains of abscess on left buttock.

## 2017-04-11 NOTE — Discharge Instructions (Signed)
-  Keflex: one tablet 4 times a day for 10 days -continue to follow with dermatology -question if that L upper leg lesion is actually hidradenitis as you have had it for several weeks and it has an appearance similar to your lesions in leg/pelvic skin fold area.

## 2017-06-14 IMAGING — CR DG CHEST 2V
2 series · 2 of 2 positions shown · non-contrast
Comparison: None.

CLINICAL DATA: 44-year-old female with cough and congestion for 4
weeks. History of asthma.

EXAM:
CHEST  2 VIEW

[chest pa]
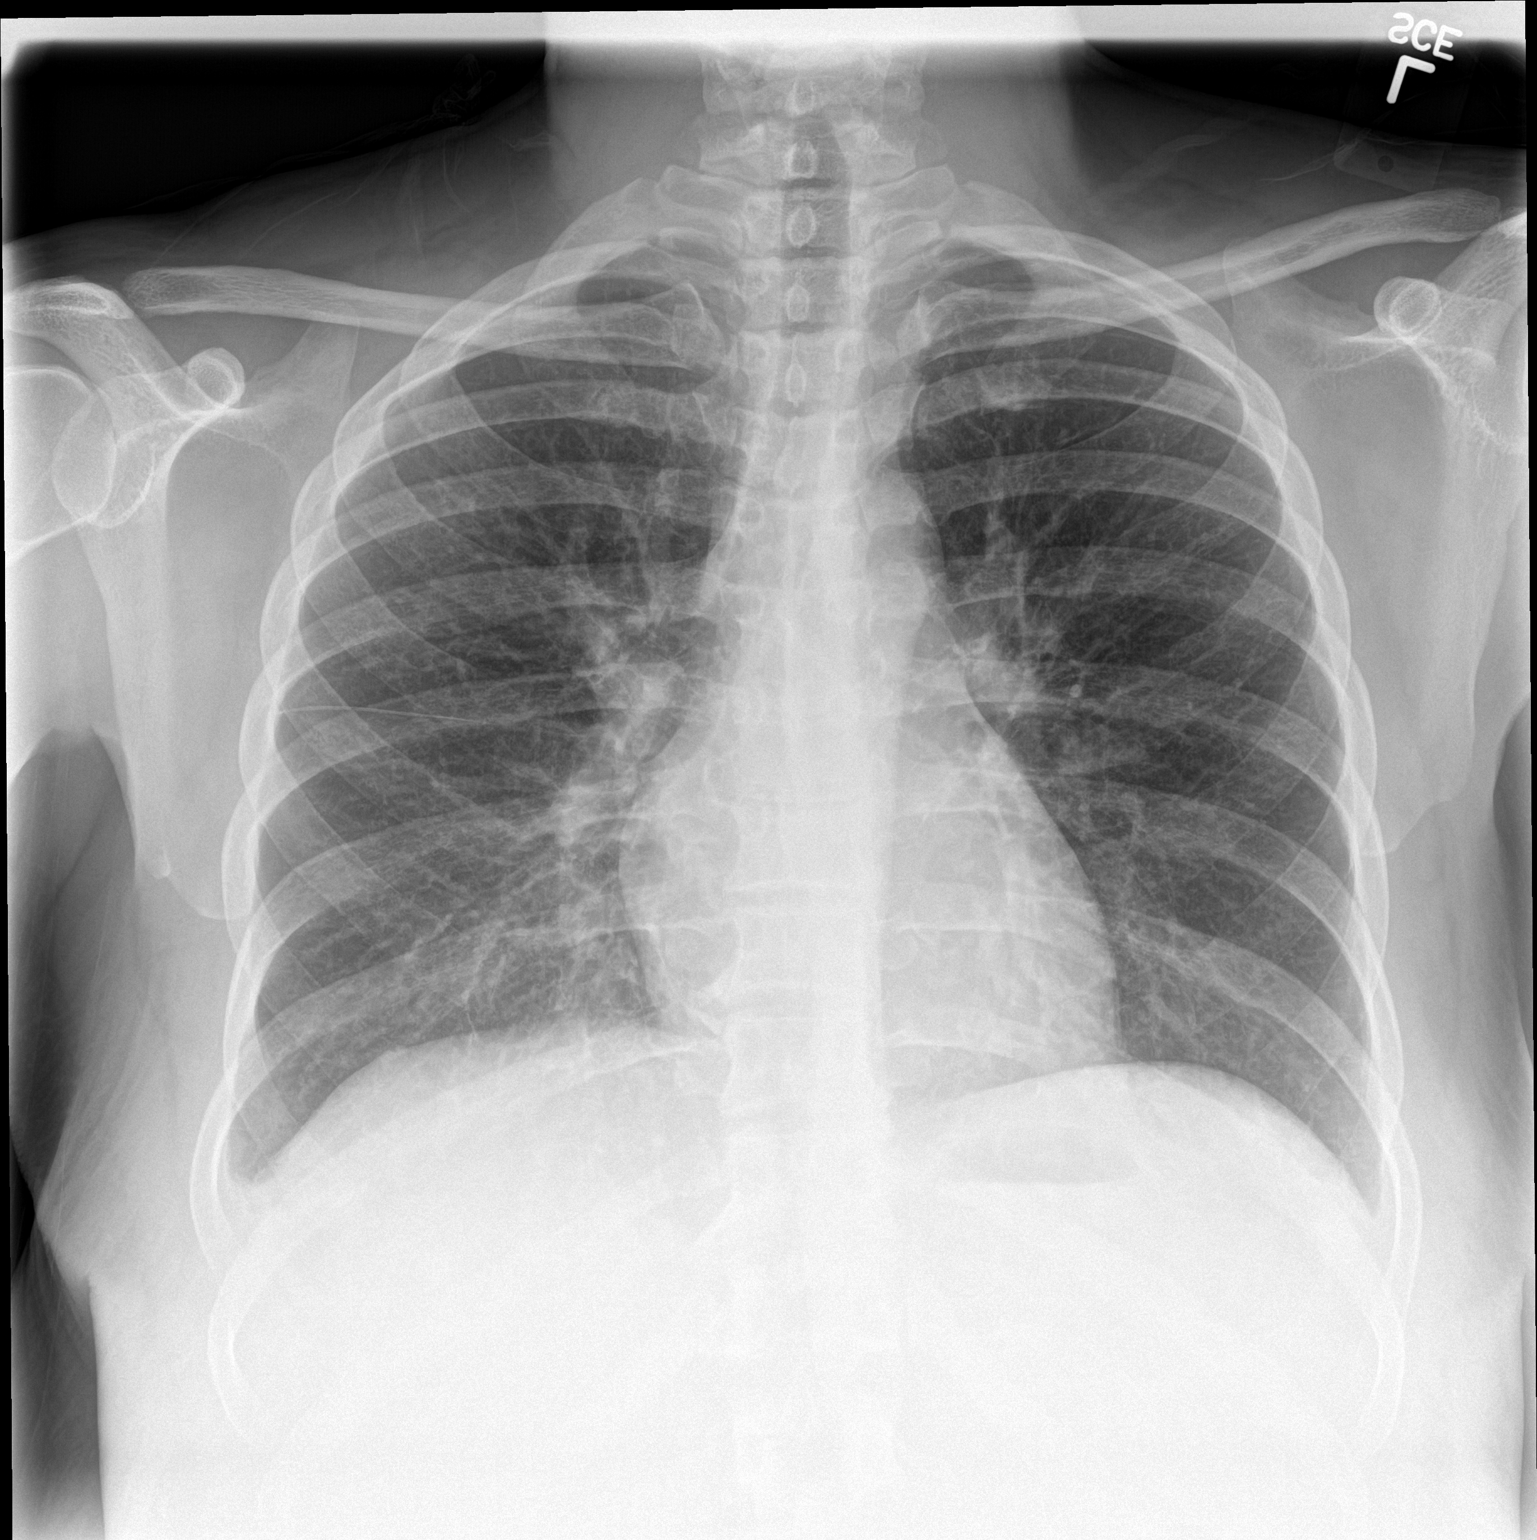

[chest lat]
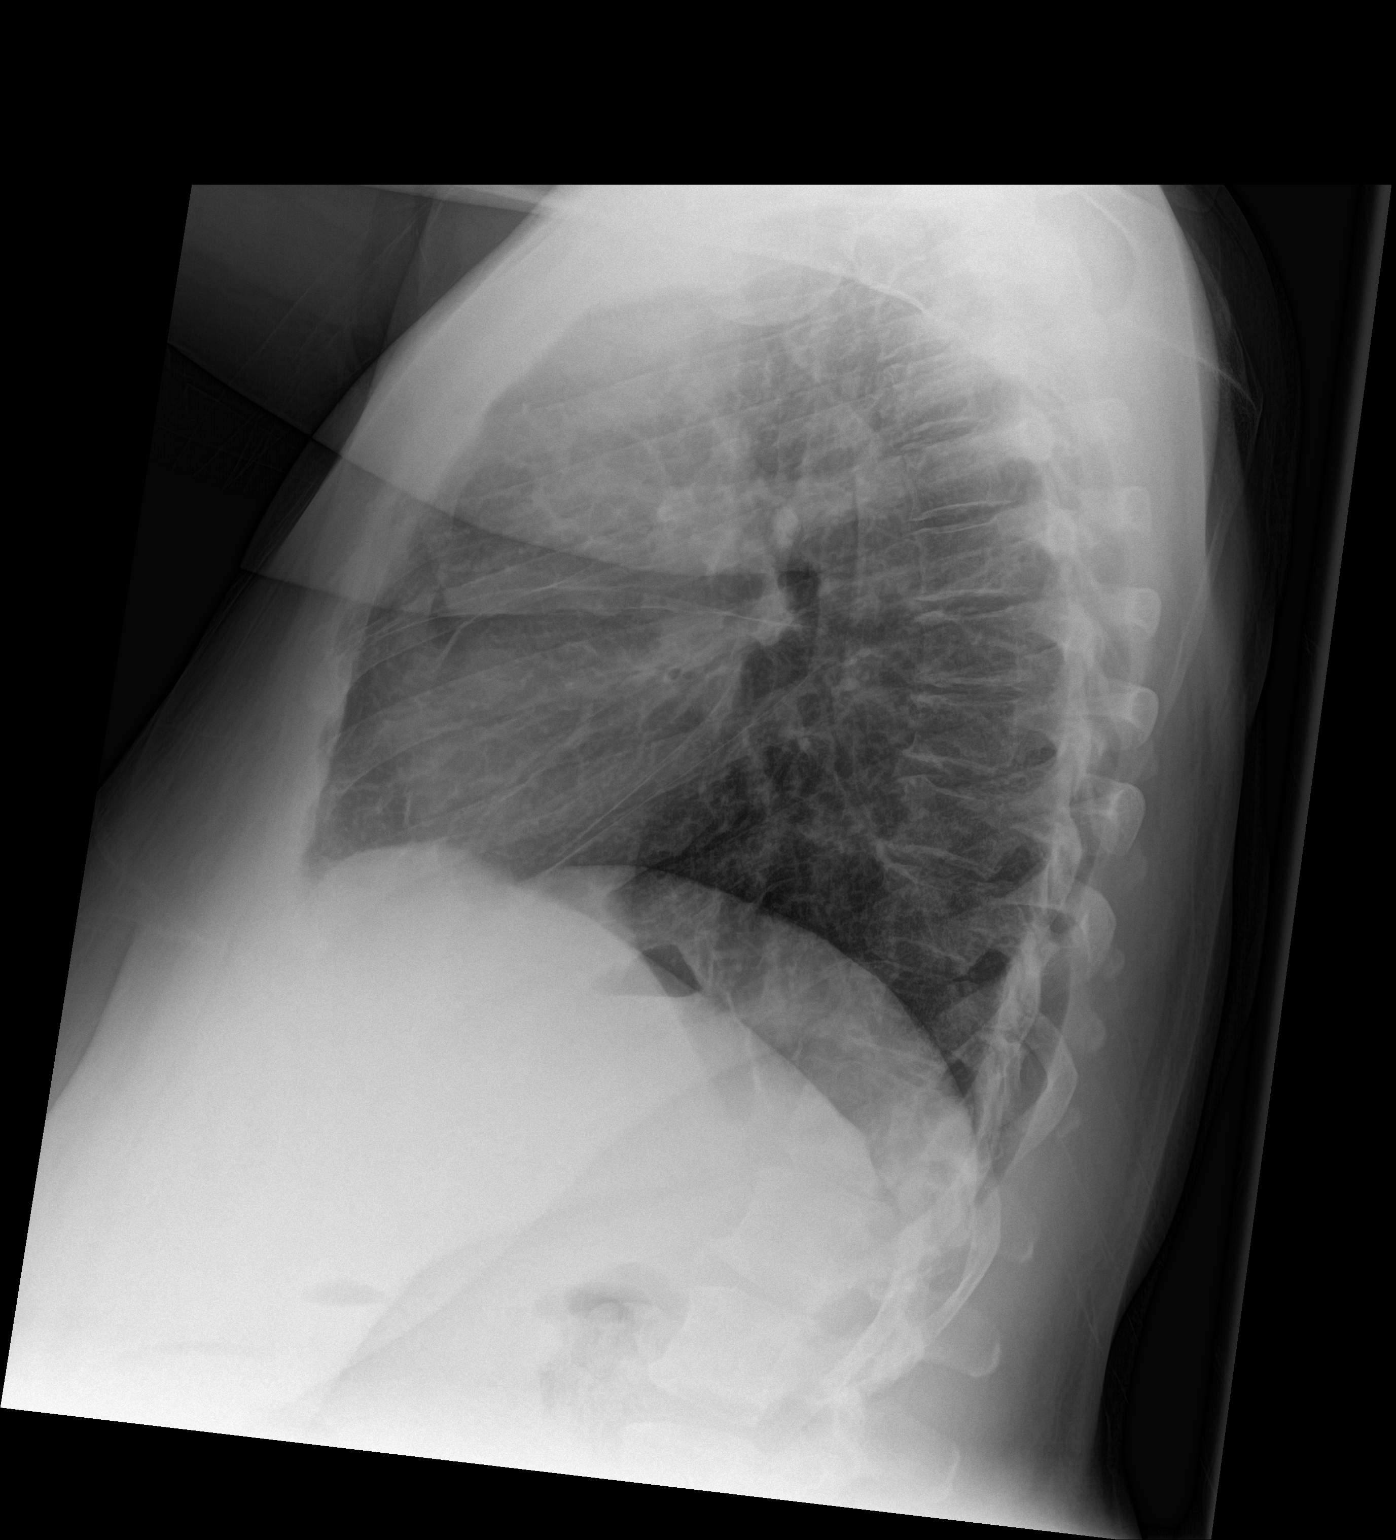

[2 of 2 positions shown; findings below may reference images not displayed]

FINDINGS: The cardiomediastinal silhouette is unremarkable.

Mild peribronchial thickening is noted.

Minimal blunting of the costophrenic angles could represent trace
effusions.

There is no evidence of focal airspace disease, pulmonary edema,
suspicious pulmonary nodule/mass, or pneumothorax. No acute bony
abnormalities are identified.
IMPRESSION: Mild peribronchial thickening of uncertain chronicity. No evidence
of focal pneumonia.

Possible trace pleural effusions.

## 2017-06-21 IMAGING — CT CT ABD-PELV W/ CM
2 of 11 series · 11 of 46 positions shown, 17 images · IV contrast (APPLIED)
Comparison: Chest x-ray 07/21/2015

CLINICAL DATA: Cough 4 weeks. Bilateral lower extremity edema.
Patient feels lump over left side as well as right side.

EXAM:
CT ANGIOGRAPHY CHEST
CT ABDOMEN AND PELVIS WITH CONTRAST
TECHNIQUE: Multidetector CT imaging of the chest was performed using the
standard protocol during bolus administration of intravenous
contrast. Multiplanar CT image reconstructions and MIPs were
obtained to evaluate the vascular anatomy. Multidetector CT imaging
of the abdomen and pelvis was performed using the standard protocol
during bolus administration of intravenous contrast.
CONTRAST:  125mL OMNIPAQUE IOHEXOL 350 MG/ML SOLN

[Series 7: venous · axial · portal-venous · 0.67mm/px · z∈[-676,-264]mm · 10 of 244 slices shown, 15 images]
[im 19/244  soft-tissue]
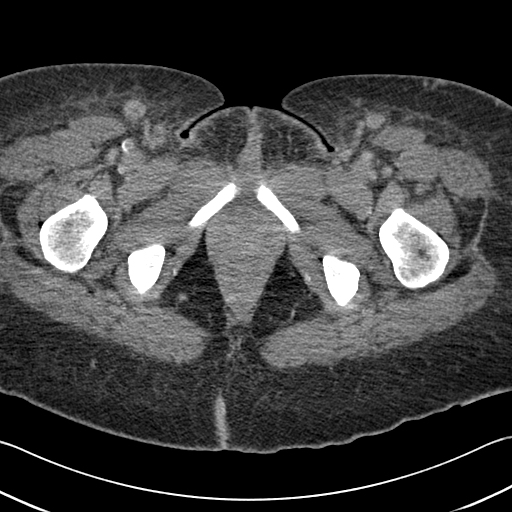
[im 19/244  bone]
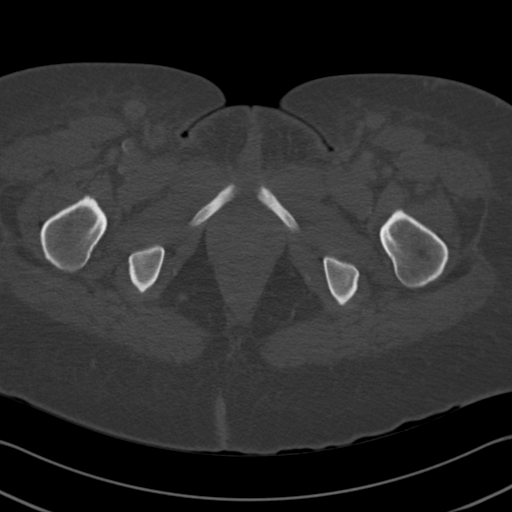
[im 57/244  soft-tissue]
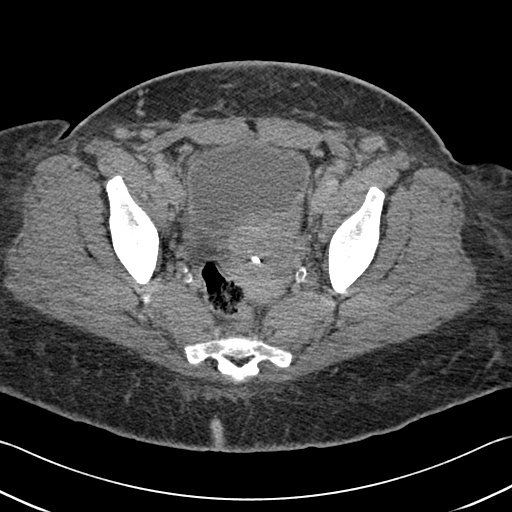
[im 75/244  soft-tissue]
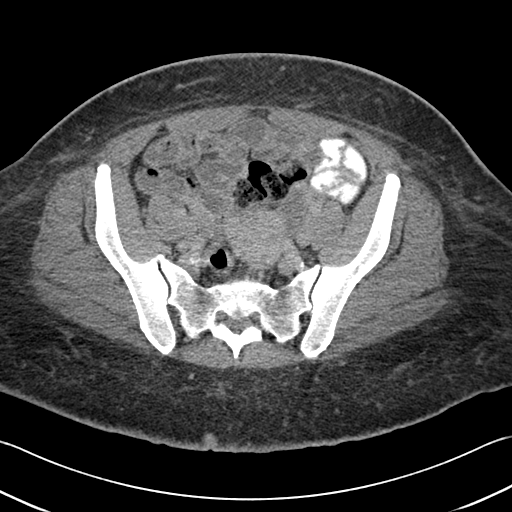
[im 94/244  soft-tissue]
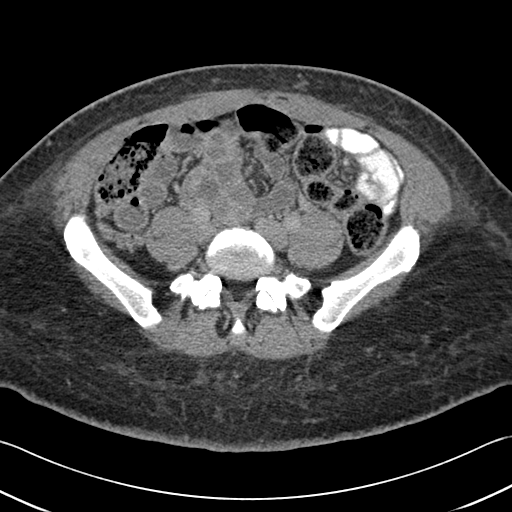
[im 131/244  soft-tissue]
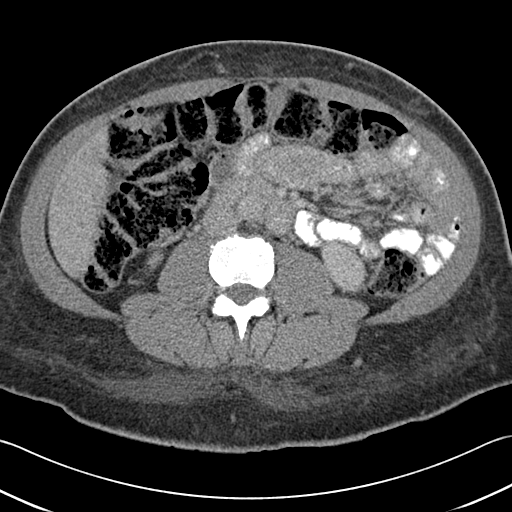
[im 150/244  soft-tissue]
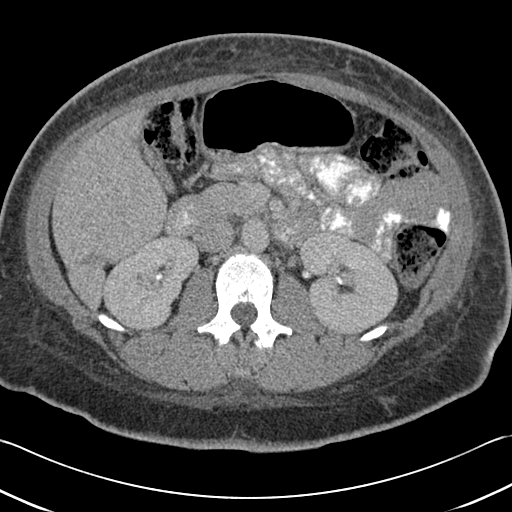
[im 169/244  soft-tissue]
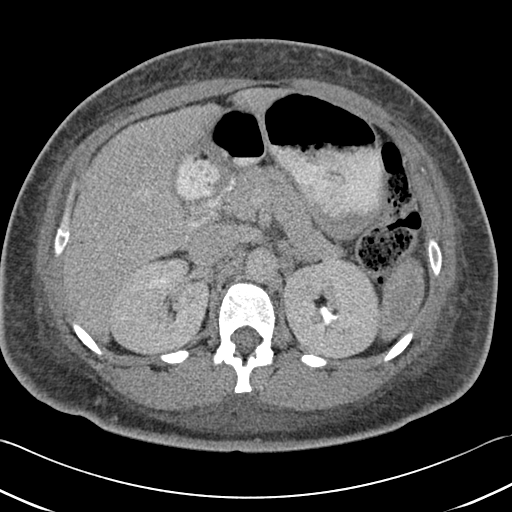
[im 169/244  lung]
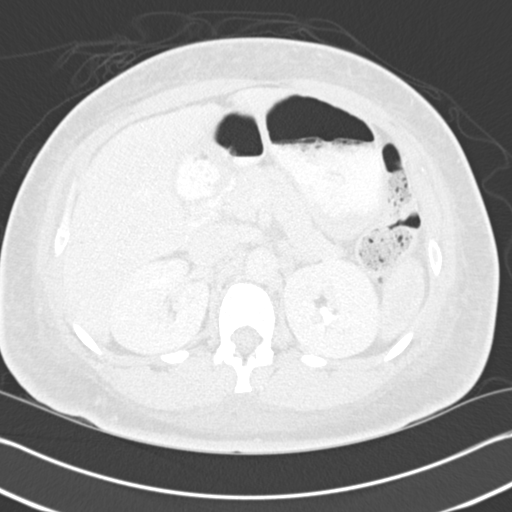
[im 187/244  lung]
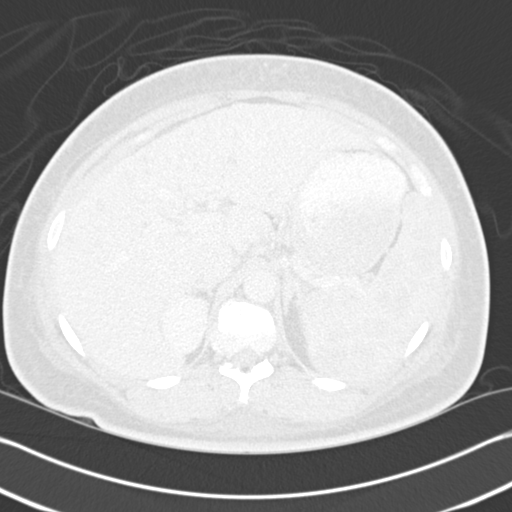
[im 206/244  soft-tissue]
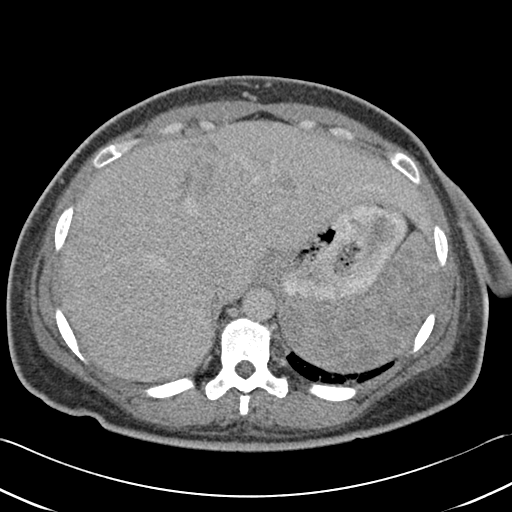
[im 206/244  lung]
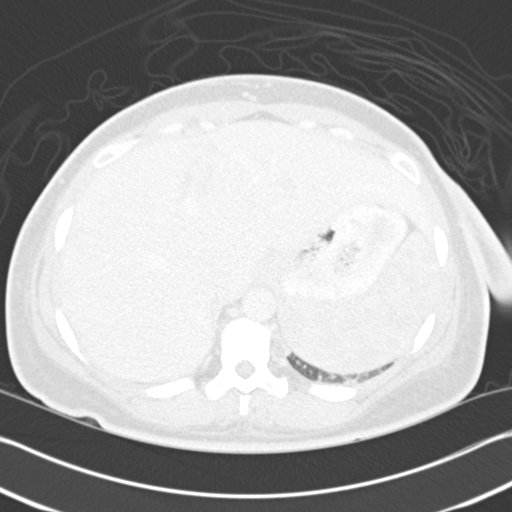
[im 225/244  soft-tissue]
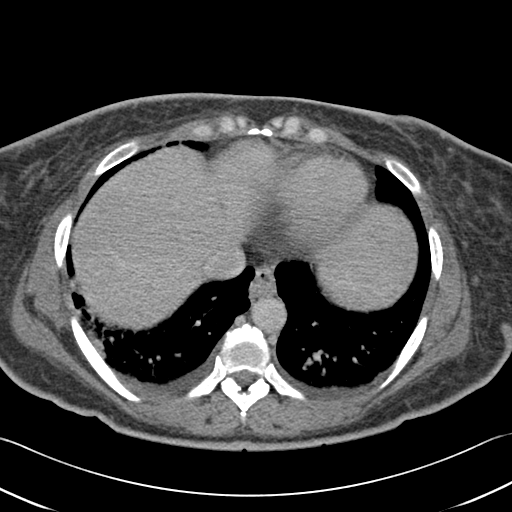
[im 225/244  lung]
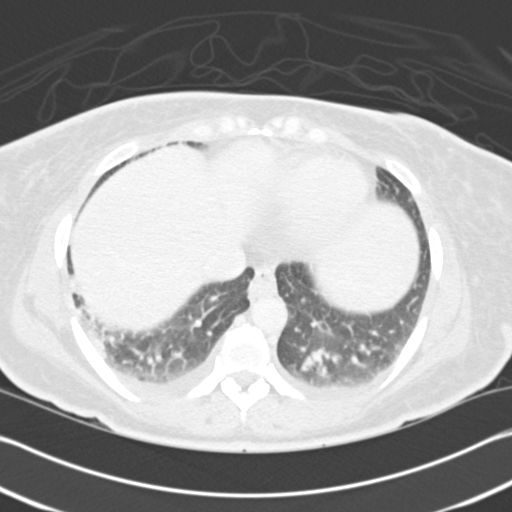
[im 225/244  bone]
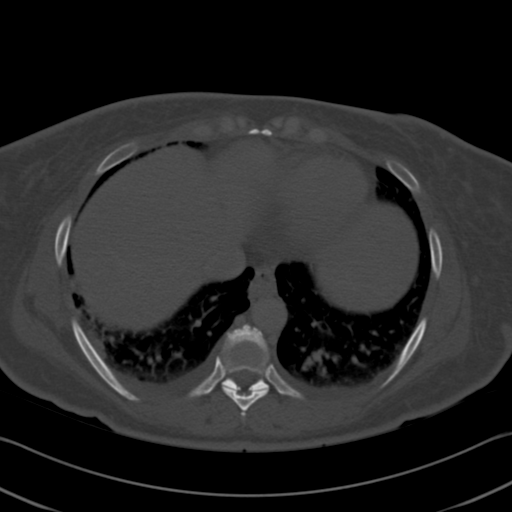

[Series 13: cor venous mpr · coronal · portal-venous · 0.70mm/px · 1 of 128 slices shown, 2 images]
[im 64/128  soft-tissue]
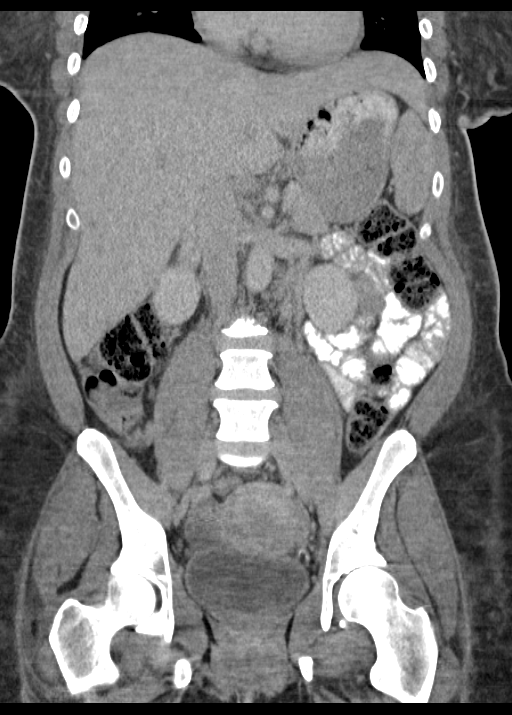
[im 64/128  bone]
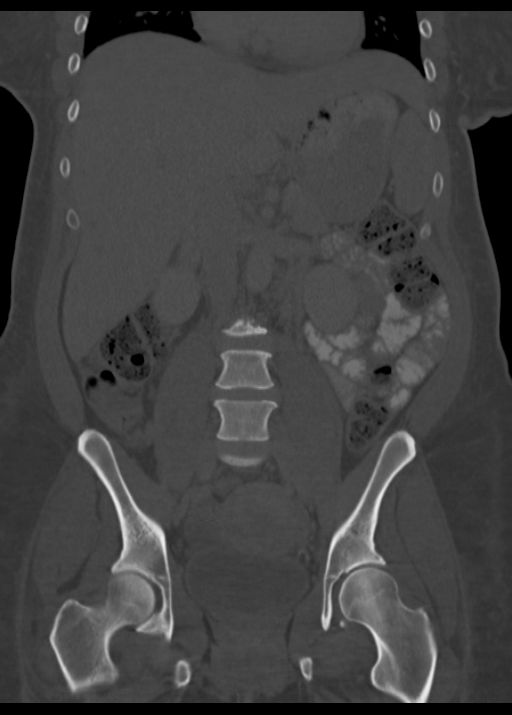

[11 of 46 positions shown; findings below may reference images not displayed]

FINDINGS: CTA CHEST FINDINGS

Lungs are adequately inflated with tiny amount of bilateral pleural
fluid. Subtle patchy reticulonodular opacification over the right
middle lobe and central right upper lobe. Subtle peripheral
opacification of the right lower lobe likely atelectasis. Airways
are within normal.

Heart is normal in size. Pulmonary arterial system is well opacified
without evidence of emboli. No significant mediastinal or hilar
adenopathy. No significant axillary adenopathy. Remaining bones and
soft tissues are within normal.

CT ABDOMEN and PELVIS FINDINGS

There is diffuse heterogeneity of the liver in the form of multiple
small well-defined hypodensities a possible mild hepatic
enlargement. Numerous ill-defined hypodensities throughout the
spleen. Spleen at the upper limits of normal in size.

Gallbladder is contracted. The pancreas and adrenal glands are
within normal. Stomach is within normal. Kidneys are normal.

Mild calcified atherosclerotic plaque over the femoral arteries as
the vascular structures are otherwise within normal.

Mild fecal retention throughout the colon. Appendix is normal. Small
bowel is unremarkable.

There is no free fluid or focal inflammatory change. No significant
adenopathy.

Pelvic images demonstrate evidence of previous bilateral tubal
ligation. An IUD is present over the mid to lower endometrial canal.
The bladder, ovaries and rectum are within normal. Few mildly
prominent symmetric superficial inguinal lymph nodes bilaterally.
Mild diffuse subcutaneous edema.

Mild degenerate change of the spine

Review of the MIP images confirms the above findings.
IMPRESSION: No evidence of pulmonary embolism.

Subtle patchy hazy reticulonodular opacification over the right lung
which may be due to right atypical infectious or inflammatory
process. Small amount of bilateral pleural fluid and right basilar
atelectasis. Consider follow-up chest CT 4-6 weeks.

Possible mild hepatic splenomegaly with multiple small ill-defined
hypodensities within the liver and spleen more prominent within the
spleen. No significant adenopathy other than mild prominence of
bilateral superficial inguinal nodes. Findings may be seen in
malignancy such as lymphoma/leukemia in addition to metastatic
disease. Infectious processes such as candidiasis may also be
considered in an immunocompromised patient. Granulomatous disease
such as TB/ sarcoidosis is less likely.

Mild diffuse subcutaneous edema.

IUD over the lower endometrial canal.

## 2017-06-29 ENCOUNTER — Ambulatory Visit: Payer: Medicaid Other

## 2017-07-06 ENCOUNTER — Ambulatory Visit
Admission: RE | Admit: 2017-07-06 | Discharge: 2017-07-06 | Disposition: A | Discharge status: Home / Self Care | Payer: Medicaid Other | Source: Ambulatory Visit | Attending: Certified Nurse Midwife | Admitting: Certified Nurse Midwife

## 2017-07-06 ENCOUNTER — Inpatient Hospital Stay: Admission: RE | Admit: 2017-07-06 | Payer: Medicaid Other | Source: Ambulatory Visit

## 2017-07-06 DIAGNOSIS — Z1231 Encounter for screening mammogram for malignant neoplasm of breast: Secondary | ICD-10-CM | POA: Insufficient documentation

## 2017-07-06 DIAGNOSIS — Z1239 Encounter for other screening for malignant neoplasm of breast: Secondary | ICD-10-CM

## 2017-08-06 ENCOUNTER — Encounter: Payer: Self-pay | Admitting: *Deleted

## 2017-08-06 ENCOUNTER — Ambulatory Visit
Admission: EM | Admit: 2017-08-06 | Discharge: 2017-08-06 | Disposition: A | Discharge status: Home / Self Care | Payer: Medicaid Other | Attending: Family Medicine | Admitting: Family Medicine

## 2017-08-06 DIAGNOSIS — L02416 Cutaneous abscess of left lower limb: Secondary | ICD-10-CM | POA: Insufficient documentation

## 2017-08-06 DIAGNOSIS — E113512 Type 2 diabetes mellitus with proliferative diabetic retinopathy with macular edema, left eye: Secondary | ICD-10-CM | POA: Diagnosis not present

## 2017-08-06 DIAGNOSIS — Z8043 Family history of malignant neoplasm of testis: Secondary | ICD-10-CM | POA: Insufficient documentation

## 2017-08-06 DIAGNOSIS — R112 Nausea with vomiting, unspecified: Secondary | ICD-10-CM

## 2017-08-06 DIAGNOSIS — E782 Mixed hyperlipidemia: Secondary | ICD-10-CM | POA: Insufficient documentation

## 2017-08-06 DIAGNOSIS — Z825 Family history of asthma and other chronic lower respiratory diseases: Secondary | ICD-10-CM | POA: Insufficient documentation

## 2017-08-06 DIAGNOSIS — Z833 Family history of diabetes mellitus: Secondary | ICD-10-CM | POA: Diagnosis not present

## 2017-08-06 DIAGNOSIS — E1165 Type 2 diabetes mellitus with hyperglycemia: Secondary | ICD-10-CM

## 2017-08-06 DIAGNOSIS — Z87891 Personal history of nicotine dependence: Secondary | ICD-10-CM | POA: Diagnosis not present

## 2017-08-06 DIAGNOSIS — L02419 Cutaneous abscess of limb, unspecified: Secondary | ICD-10-CM

## 2017-08-06 DIAGNOSIS — I10 Essential (primary) hypertension: Secondary | ICD-10-CM | POA: Insufficient documentation

## 2017-08-06 DIAGNOSIS — R197 Diarrhea, unspecified: Secondary | ICD-10-CM | POA: Diagnosis not present

## 2017-08-06 DIAGNOSIS — Z79899 Other long term (current) drug therapy: Secondary | ICD-10-CM | POA: Diagnosis not present

## 2017-08-06 DIAGNOSIS — Z800 Family history of malignant neoplasm of digestive organs: Secondary | ICD-10-CM | POA: Diagnosis not present

## 2017-08-06 DIAGNOSIS — Z794 Long term (current) use of insulin: Secondary | ICD-10-CM | POA: Insufficient documentation

## 2017-08-06 DIAGNOSIS — L03116 Cellulitis of left lower limb: Secondary | ICD-10-CM | POA: Insufficient documentation

## 2017-08-06 DIAGNOSIS — L03119 Cellulitis of unspecified part of limb: Secondary | ICD-10-CM

## 2017-08-06 LAB — URINALYSIS, COMPLETE (UACMP) WITH MICROSCOPIC
Bilirubin Urine: NEGATIVE
Glucose, UA: 500 mg/dL — AB
Ketones, ur: 15 mg/dL — AB
Nitrite: NEGATIVE
Protein, ur: >300 mg/dL — AB
Specific Gravity, Urine: 1.02 (ref 1.005–1.030)
pH: 6.5 (ref 5.0–8.0)

## 2017-08-06 LAB — CBC WITH DIFFERENTIAL/PLATELET
Basophils Absolute: 0.2 K/uL — ABNORMAL HIGH (ref 0–0.1)
Basophils Relative: 1 %
Eosinophils Absolute: 0.2 K/uL (ref 0–0.7)
Eosinophils Relative: 1 %
HCT: 34.7 % — ABNORMAL LOW (ref 35.0–47.0)
Hemoglobin: 11.4 g/dL — ABNORMAL LOW (ref 12.0–16.0)
Lymphocytes Relative: 9 %
Lymphs Abs: 1.6 K/uL (ref 1.0–3.6)
MCH: 28.3 pg (ref 26.0–34.0)
MCHC: 32.8 g/dL (ref 32.0–36.0)
MCV: 86.3 fL (ref 80.0–100.0)
Monocytes Absolute: 0.8 K/uL (ref 0.2–0.9)
Monocytes Relative: 5 %
Neutro Abs: 14.4 K/uL — ABNORMAL HIGH (ref 1.4–6.5)
Neutrophils Relative %: 84 %
Platelets: 351 K/uL (ref 150–440)
RBC: 4.02 MIL/uL (ref 3.80–5.20)
RDW: 13.1 % (ref 11.5–14.5)
WBC: 17.2 K/uL — ABNORMAL HIGH (ref 3.6–11.0)

## 2017-08-06 LAB — COMPREHENSIVE METABOLIC PANEL
ALT: 11 U/L — ABNORMAL LOW (ref 14–54)
AST: 13 U/L — ABNORMAL LOW (ref 15–41)
Albumin: 1.8 g/dL — ABNORMAL LOW (ref 3.5–5.0)
Alkaline Phosphatase: 111 U/L (ref 38–126)
Anion gap: 6 (ref 5–15)
BUN: 17 mg/dL (ref 6–20)
CO2: 28 mmol/L (ref 22–32)
Calcium: 7.8 mg/dL — ABNORMAL LOW (ref 8.9–10.3)
Chloride: 100 mmol/L — ABNORMAL LOW (ref 101–111)
Creatinine, Ser: 0.95 mg/dL (ref 0.44–1.00)
GFR calc Af Amer: >60 mL/min (ref >60)
GFR calc non Af Amer: >60 mL/min (ref >60)
Glucose, Bld: 363 mg/dL — ABNORMAL HIGH (ref 65–99)
Potassium: 4.5 mmol/L (ref 3.5–5.1)
Sodium: 134 mmol/L — ABNORMAL LOW (ref 135–145)
Total Bilirubin: 0.4 mg/dL (ref 0.3–1.2)
Total Protein: 6.3 g/dL — ABNORMAL LOW (ref 6.5–8.1)

## 2017-08-06 LAB — GLUCOSE, CAPILLARY: GLUCOSE-CAPILLARY: 338 mg/dL — AB (ref 65–99)

## 2017-08-06 LAB — PREGNANCY, URINE: Preg Test, Ur: NEGATIVE

## 2017-08-06 MED ORDER — ONDANSETRON HCL 4 MG/2ML IJ SOLN
4.0000 mg | Freq: Once | INTRAMUSCULAR | Status: AC
  Administered 2017-08-06: 4 mg via INTRAVENOUS

## 2017-08-06 MED ORDER — SULFAMETHOXAZOLE-TRIMETHOPRIM 800-160 MG PO TABS: 1.0000 | ORAL_TABLET | Freq: Two times a day (BID) | ORAL | 0 refills | Status: AC

## 2017-08-06 MED ORDER — CEFTRIAXONE SODIUM 1 G IJ SOLR
1.0000 g | Freq: Once | INTRAMUSCULAR | Status: AC
  Administered 2017-08-06: 1 g via INTRAMUSCULAR

## 2017-08-06 MED ORDER — DOXYCYCLINE HYCLATE 100 MG PO CAPS: 100.0000 mg | ORAL_CAPSULE | Freq: Two times a day (BID) | ORAL | 0 refills | Status: DC

## 2017-08-06 MED ORDER — SODIUM CHLORIDE 0.9 % IV BOLUS (SEPSIS)
1000.0000 mL | Freq: Once | INTRAVENOUS | Status: AC
  Administered 2017-08-06: 1000 mL via INTRAVENOUS

## 2017-08-06 MED ORDER — ONDANSETRON 8 MG PO TBDP: 8.0000 mg | ORAL_TABLET | Freq: Two times a day (BID) | ORAL | 0 refills | Status: AC

## 2017-08-06 MED ORDER — SULFAMETHOXAZOLE-TRIMETHOPRIM 800-160 MG PO TABS: 1.0000 | ORAL_TABLET | Freq: Two times a day (BID) | ORAL | 0 refills | Status: DC

## 2017-08-06 NOTE — ED Notes (Signed)
Pt in treatment room.  In NAD.  Needs address.  Pt/Fam updated on POC.   

## 2017-08-06 NOTE — ED Triage Notes (Signed)
N/V/D x4 days, Pt is IDDM, has not checked CBG recently. Also, abcess to left buttock.

## 2017-08-06 NOTE — ED Provider Notes (Signed)
[facility]    CSN: [id number] Arrival date & time: 08/06/17  1019     History   Chief Complaint Chief Complaint  Patient presents with  . Abscess  . Nausea  . Emesis  . Diarrhea    HPI Cassandra Black is a 47 y.o. female.   HPI  47 year old female insulin dependent diabetic with poor compliance, presents with nausea vomiting and diarrhea for 4 days.  States that she will vomit at least twice every morning.  Diarrhea is described as thick loose stool.  There is been no blood or mucus.  States that it is not black nor tarry.  Has has no coffee-ground appearing material.  Is a patient at [facility] primary but does not have an endocrinologist presently since the endocrinologist she was seeing left town.  No fever but has had some chills. Today she is afebrile, pulse 95, blood pressure 135/84 respirations 16, O2 sats 100%.  Her medical record shows her to have consistently very high sugars her latest globin A1c on 07/28/2016 of 14.8 with the average blood glucose of 378.       Past Medical History:  Diagnosis Date  . Acne   . Anemia   . Asthma   . Cataract   . Diabetic retinopathy of both eyes (HCC)   . Glaucoma   . HSV infection   . Hydradenitis   . Hypertension   . Uncontrolled diabetes mellitus [facility])     Patient Active Problem List   Diagnosis Date Noted  . Acute cystitis without hematuria 03/19/2017  . BV (bacterial vaginosis) 03/19/2017  . Diabetes mellitus type 2, uncomplicated (HCC) 02/24/2017  . Menorrhagia 02/09/2017  . Hypertension   . Herpes genitalis   . Glaucoma   . Uncontrolled diabetes mellitus (HCC)   . Asthma   . Anemia   . Hydradenitis   . 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 Mixed hyperlipidemia 07/17/2015  . Acute systolic CHF (congestive heart failure) (HCC) 07/17/2015  . 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 Diabetic macular edema (HCC) 08/31/2014    Past Surgical  History:  Procedure Laterality Date  . BREAST CYST EXCISION Left    neg  . CESAREAN SECTION  1999/ 2010   Twins in 1999 (23 weeks)/ repeat CS in 2010  . EYE SURGERY     laser    OB History    Gravida Para Term Preterm AB Living   3 2 1 1 1 2    SAB TAB Ectopic Multiple Live Births   1     1 2       Obstetric Comments   1st Menstrual Cycle:  15 1st Pregnancy:  26        Home Medications    Prior to Admission medications   Medication Sig Start Date End Date Taking? Authorizing Provider  levonorgestrel (MIRENA) 20 MCG/24HR IUD 1 each by Intrauterine route once.   Yes [provider]  valACYclovir (VALTREX) 500 MG tablet Take 500 mg by mouth 2 (two) times daily.   Yes [provider]  BENZACLIN WITH PUMP gel USE 1(ONE) APPLICATION(S) TOPICAL EVERY DAY 01/08/17   [provider]  insulin aspart (NOVOLOG) 100 UNIT/ML FlexPen Inject 22 Units into the skin 3 (three) times daily. 07/10/15   [provider]  Insulin Glargine (LANTUS SOLOSTAR) 100 UNIT/ML Solostar Pen Inject 45 Units into the skin Nightly. 07/10/15   [provider]  Lancets 28G MISC  02/02/14   [provider]  lisinopril (PRINIVIL,ZESTRIL) 5 MG tablet Take 5 mg by mouth daily. 06/01/15 04/11/17  [provider]  metFORMIN (GLUCOPHAGE-XR) 500 MG 24 hr tablet Take 500 mg by mouth daily. 06/01/15 04/11/17  [provider]  ondansetron (ZOFRAN ODT) 8 MG disintegrating tablet Take 1 tablet (8 mg total) by mouth 2 (two) times daily. 08/06/17   Lutricia Feil, PA-C  sulfamethoxazole-trimethoprim (BACTRIM DS,SEPTRA DS) 800-160 MG tablet Take 1 tablet by mouth 2 (two) times daily. 08/06/17   Lutricia Feil, PA-C  triamterene-hydrochlorothiazide (MAXZIDE-25) 37.5-25 MG tablet Take 1 tablet by mouth daily.    [provider]    Family History Family History  Problem Relation Age of Onset  . COPD Father   . Diabetes Mother   . Cancer Mother 2        pancreatic  . Diabetes Sister   . Testicular cancer Son 8       Rhabdomyosarcoma  . Breast cancer Maternal Grandmother     Social History Social History   Tobacco Use  . Smoking status: Former Games developer  . Smokeless tobacco: Never Used  Substance Use Topics  . Alcohol use: Yes    Comment: occasionally  . Drug use: No     Allergies   Patient has no known allergies.   Review of Systems Review of Systems  Constitutional: Positive for activity change, appetite change, chills and fatigue. Negative for fever.  Gastrointestinal: Positive for diarrhea, nausea and vomiting.  All other systems reviewed and are negative.    Physical Exam Triage Vital Signs ED Triage Vitals  Enc Vitals Group     BP 08/06/17 1125 135/84     Pulse Rate 08/06/17 1125 95     Resp 08/06/17 1125 16     Temp 08/06/17 1125 98.6 F (37 C)     Temp Source 08/06/17 1125 Oral     SpO2 08/06/17 1125 100 %     Weight 08/06/17 1128 184 lb (83.5 kg)     Height 08/06/17 1128 5\' 5"  (1.651 m)     Head Circumference --      Peak Flow --      Pain Score --      Pain Loc --      Pain Edu? --      Excl. in GC? --    No data found.  Updated Vital Signs BP (!) 141/87   Pulse 98   Temp 98.2 F (36.8 C) (Oral)   Resp 16   Ht 5\' 5"  (1.651 m)   Wt 184 lb (83.5 kg)   SpO2 100%   BMI 30.62 kg/m   Visual Acuity Right Eye Distance:   Left Eye Distance:   Bilateral Distance:    Right Eye Near:   Left Eye Near:    Bilateral Near:     Physical Exam  Constitutional: She is oriented to person, place, and time. She appears well-developed and well-nourished. No distress.  HENT:  Head: Normocephalic.  Eyes: Pupils are equal, round, and reactive to light. Right eye exhibits no discharge. Left eye exhibits no discharge.  Neck: Normal range of motion.  Cardiovascular: Normal rate, regular rhythm and normal heart sounds.  Pulmonary/Chest: Effort normal and breath sounds normal.  Abdominal: Soft. Bowel sounds  are normal.  Musculoskeletal: Normal range of motion.  Neurological: She is alert and oriented to person, place, and time.  Skin: Skin is warm and dry. She is not diaphoretic.  Examination  the left upper thigh near the peroneal crease reveals a large amount of sero purulent drainage from an indurated area.  This was decompressed manually expressing all of the serro purulent discharge as possible.  After this was fully expressed, a large ABD pad was placed to capture all of the residual drainage.  Likely accounted for the patient's elevated white blood count.  She was given 1 g of Rocephin intramuscularly and placed on Septra DS twice daily for 7-10 days.  The drainage area did not encroach on the labia.  Was localized to the upper inner thigh.  Psychiatric: She has a normal mood and affect. Her behavior is normal. Judgment and thought content normal.  Nursing note and vitals reviewed.    UC Treatments / Results  Labs (all labs ordered are listed, but only abnormal results are displayed) Labs Reviewed  GLUCOSE, CAPILLARY - Abnormal; Notable for the following components:      Result Value   Glucose-Capillary 338 (*)    All other components within normal limits  CBC WITH DIFFERENTIAL/PLATELET - Abnormal; Notable for the following components:   WBC 17.2 (*)    Hemoglobin 11.4 (*)    HCT 34.7 (*)    Neutro Abs 14.4 (*)    Basophils Absolute 0.2 (*)    All other components within normal limits  COMPREHENSIVE METABOLIC PANEL - Abnormal; Notable for the following components:   Sodium 134 (*)    Chloride 100 (*)    Glucose, Bld 363 (*)    Calcium 7.8 (*)    Total Protein 6.3 (*)    Albumin 1.8 (*)    AST 13 (*)    ALT 11 (*)    All other components within normal limits  URINALYSIS, COMPLETE (UACMP) WITH MICROSCOPIC - Abnormal; Notable for the following components:   APPearance CLOUDY (*)    Glucose, UA 500 (*)    Hgb urine dipstick LARGE (*)    Ketones, ur 15 (*)    Protein, ur >300  (*)    Leukocytes, UA TRACE (*)    Squamous Epithelial / LPF 6-30 (*)    Bacteria, UA MANY (*)    All other components within normal limits  PREGNANCY, URINE  CBG MONITORING, ED    EKG  EKG Interpretation None       Radiology No results found.  Procedures Procedures (including critical care time)  Medications Ordered in UC Medications  sodium chloride 0.9 % bolus 1,000 mL (0 mLs Intravenous Stopped 08/06/17 1544)  ondansetron (ZOFRAN) injection 4 mg (4 mg Intravenous Given 08/06/17 1335)  cefTRIAXone (ROCEPHIN) injection 1 g (1 g Intramuscular Given 08/06/17 1535)     Initial Impression / Assessment and Plan / UC Course  I have reviewed the triage vital signs and the nursing notes.  Pertinent labs & imaging results that were available during my care of the patient were reviewed by me and considered in my medical decision making (see chart for details).     Discussed with the patient that I believe she would benefit from going to the emergency room for further evaluation and possible admission because of her hyperglycemia well controlled large indurated abscess left upper thigh.  Responsibilities at home with her children and was reluctant to go tonight.  She states that she would rather be home with her children tonight and then go to the emergency room early in the morning.  Told her that is her right , but still believe that she would do better in  the emergency room.  I prescribed Septra DS for twice a day dosage told her to expect continued drainage from the area and will need to reinforce the dressings on a constant basis.  Further needs to set up appointment with her primary care physician; needs to be more compliant with her insulin and oral medications in order to obtain a more normal glucose.  UA today also showed a urinary tract infection.  Add Keflex to her antibiotic regimen as her previous UTI was susceptible to Keflex.  Final Clinical Impressions(s) / UC Diagnoses    Final diagnoses:  Cellulitis and abscess of leg  Type 2 diabetes mellitus with hyperglycemia, unspecified whether long term insulin use (HCC)  Nausea vomiting and diarrhea    ED Discharge Orders        Ordered    doxycycline (VIBRAMYCIN) 100 MG capsule  2 times daily,   Status:  Discontinued     08/06/17 1536    ondansetron (ZOFRAN ODT) 8 MG disintegrating tablet  2 times daily     08/06/17 1536    sulfamethoxazole-trimethoprim (BACTRIM DS,SEPTRA DS) 800-160 MG tablet  2 times daily,   Status:  Discontinued     08/06/17 1604    sulfamethoxazole-trimethoprim (BACTRIM DS,SEPTRA DS) 800-160 MG tablet  2 times daily     08/06/17 1612       Controlled Substance Prescriptions [place] Controlled Substance Registry consulted? Not Applicable   Lutricia Feil, PA-C 08/06/17 2051

## 2017-08-06 NOTE — Discharge Instructions (Signed)
You have chosen not to go to the emergency room tonight.  Recommend that if you worsen overnight you need to go to the emergency room for follow-up.  Stated that you will go to the emergency room tomorrow after your children go off to school.  Expect continued drainage from the abscess on your upper thigh.  Reinforce the dressings to allow to drain. Continue to drink plenty of fluids tonight and tomorrow

## 2017-08-06 NOTE — ED Notes (Signed)
ED Provider at bedside. 

## 2017-08-07 ENCOUNTER — Emergency Department
Admission: EM | Admit: 2017-08-07 | Discharge: 2017-08-07 | Disposition: A | Discharge status: Home / Self Care | Payer: Medicaid Other | Attending: Emergency Medicine | Admitting: Emergency Medicine

## 2017-08-07 ENCOUNTER — Encounter: Payer: Self-pay | Admitting: Emergency Medicine

## 2017-08-07 ENCOUNTER — Telehealth: Payer: Self-pay | Admitting: Emergency Medicine

## 2017-08-07 DIAGNOSIS — N3000 Acute cystitis without hematuria: Secondary | ICD-10-CM | POA: Diagnosis not present

## 2017-08-07 DIAGNOSIS — Z79899 Other long term (current) drug therapy: Secondary | ICD-10-CM | POA: Insufficient documentation

## 2017-08-07 DIAGNOSIS — I110 Hypertensive heart disease with heart failure: Secondary | ICD-10-CM | POA: Insufficient documentation

## 2017-08-07 DIAGNOSIS — I5021 Acute systolic (congestive) heart failure: Secondary | ICD-10-CM | POA: Diagnosis not present

## 2017-08-07 DIAGNOSIS — E1165 Type 2 diabetes mellitus with hyperglycemia: Secondary | ICD-10-CM | POA: Diagnosis not present

## 2017-08-07 DIAGNOSIS — Z91148 Patient's other noncompliance with medication regimen for other reason: Secondary | ICD-10-CM

## 2017-08-07 DIAGNOSIS — L0231 Cutaneous abscess of buttock: Secondary | ICD-10-CM | POA: Diagnosis present

## 2017-08-07 DIAGNOSIS — Z87891 Personal history of nicotine dependence: Secondary | ICD-10-CM | POA: Diagnosis not present

## 2017-08-07 DIAGNOSIS — R809 Proteinuria, unspecified: Secondary | ICD-10-CM

## 2017-08-07 DIAGNOSIS — J45909 Unspecified asthma, uncomplicated: Secondary | ICD-10-CM | POA: Diagnosis not present

## 2017-08-07 DIAGNOSIS — R739 Hyperglycemia, unspecified: Secondary | ICD-10-CM

## 2017-08-07 DIAGNOSIS — Z794 Long term (current) use of insulin: Secondary | ICD-10-CM | POA: Insufficient documentation

## 2017-08-07 DIAGNOSIS — Z9114 Patient's other noncompliance with medication regimen: Secondary | ICD-10-CM | POA: Insufficient documentation

## 2017-08-07 LAB — URINALYSIS, COMPLETE (UACMP) WITH MICROSCOPIC
BACTERIA UA: NONE SEEN
Bilirubin Urine: NEGATIVE
Glucose, UA: >=500 mg/dL — AB
Hgb urine dipstick: NEGATIVE
KETONES UR: 5 mg/dL — AB
Nitrite: NEGATIVE
Specific Gravity, Urine: 1.02 (ref 1.005–1.030)
pH: 7 (ref 5.0–8.0)

## 2017-08-07 LAB — CBC WITH DIFFERENTIAL/PLATELET
BASOS ABS: 0 10*3/uL (ref 0–0.1)
BASOS PCT: 0 %
EOS ABS: 0.4 10*3/uL (ref 0–0.7)
Eosinophils Relative: 3 %
HCT: 37.2 % (ref 35.0–47.0)
HEMOGLOBIN: 11.8 g/dL — AB (ref 12.0–16.0)
Lymphocytes Relative: 11 %
Lymphs Abs: 1.6 10*3/uL (ref 1.0–3.6)
MCH: 27.9 pg (ref 26.0–34.0)
MCHC: 31.8 g/dL — AB (ref 32.0–36.0)
MCV: 87.6 fL (ref 80.0–100.0)
MONOS PCT: 4 %
Monocytes Absolute: 0.6 10*3/uL (ref 0.2–0.9)
NEUTROS ABS: 11.7 10*3/uL — AB (ref 1.4–6.5)
NEUTROS PCT: 82 %
Platelets: 401 10*3/uL (ref 150–440)
RBC: 4.24 MIL/uL (ref 3.80–5.20)
RDW: 13.4 % (ref 11.5–14.5)
WBC: 14.3 10*3/uL — AB (ref 3.6–11.0)

## 2017-08-07 LAB — COMPREHENSIVE METABOLIC PANEL
ALBUMIN: 2.2 g/dL — AB (ref 3.5–5.0)
ALK PHOS: 124 U/L (ref 38–126)
ALT: 12 U/L — ABNORMAL LOW (ref 14–54)
ANION GAP: 10 (ref 5–15)
AST: 14 U/L — AB (ref 15–41)
BUN: 19 mg/dL (ref 6–20)
CALCIUM: 8.1 mg/dL — AB (ref 8.9–10.3)
CO2: 25 mmol/L (ref 22–32)
Chloride: 100 mmol/L — ABNORMAL LOW (ref 101–111)
Creatinine, Ser: 1.23 mg/dL — ABNORMAL HIGH (ref 0.44–1.00)
GFR calc Af Amer: >60 mL/min (ref >60)
GFR calc non Af Amer: 52 mL/min — ABNORMAL LOW (ref >60)
GLUCOSE: 388 mg/dL — AB (ref 65–99)
Potassium: 3.7 mmol/L (ref 3.5–5.1)
SODIUM: 135 mmol/L (ref 135–145)
Total Bilirubin: 0.9 mg/dL (ref 0.3–1.2)
Total Protein: 7.2 g/dL (ref 6.5–8.1)

## 2017-08-07 LAB — LACTIC ACID, PLASMA: LACTIC ACID, VENOUS: 1 mmol/L (ref 0.5–1.9)

## 2017-08-07 LAB — GLUCOSE, CAPILLARY
Glucose-Capillary: 312 mg/dL — ABNORMAL HIGH (ref 65–99)
Glucose-Capillary: 308 mg/dL — ABNORMAL HIGH (ref 65–99)

## 2017-08-07 MED ORDER — SODIUM CHLORIDE 0.9 % IV BOLUS (SEPSIS)
1000.0000 mL | Freq: Once | INTRAVENOUS | Status: AC
  Administered 2017-08-07: 1000 mL via INTRAVENOUS

## 2017-08-07 MED ORDER — LIDOCAINE HCL (PF) 1 % IJ SOLN
INTRAMUSCULAR | Status: AC
  Filled 2017-08-07: qty 5

## 2017-08-07 MED ORDER — PROMETHAZINE HCL 25 MG/ML IJ SOLN
12.5000 mg | Freq: Once | INTRAMUSCULAR | Status: AC
  Administered 2017-08-07: 12.5 mg via INTRAVENOUS

## 2017-08-07 MED ORDER — CLINDAMYCIN HCL 300 MG PO CAPS: 300.0000 mg | ORAL_CAPSULE | Freq: Three times a day (TID) | ORAL | 0 refills | Status: AC

## 2017-08-07 MED ORDER — GLUCOSE BLOOD VI STRP: ORAL_STRIP | 12 refills | Status: AC

## 2017-08-07 MED ORDER — PROMETHAZINE HCL 12.5 MG PO TABS: 12.5000 mg | ORAL_TABLET | Freq: Four times a day (QID) | ORAL | 0 refills | Status: AC | PRN

## 2017-08-07 MED ORDER — ONDANSETRON HCL 4 MG/2ML IJ SOLN
4.0000 mg | Freq: Once | INTRAMUSCULAR | Status: AC
  Administered 2017-08-07: 4 mg via INTRAVENOUS
  Filled 2017-08-07: qty 2

## 2017-08-07 MED ORDER — BLOOD GLUCOSE MONITORING SUPPL DEVI: 1.0000 | Freq: Three times a day (TID) | 0 refills | Status: AC

## 2017-08-07 MED ORDER — CLINDAMYCIN PHOSPHATE 600 MG/50ML IV SOLN
600.0000 mg | Freq: Once | INTRAVENOUS | Status: AC
  Administered 2017-08-07: 600 mg via INTRAVENOUS
  Filled 2017-08-07: qty 50

## 2017-08-07 MED ORDER — LIDOCAINE HCL (PF) 1 % IJ SOLN
10.0000 mL | Freq: Once | INTRAMUSCULAR | Status: AC
  Administered 2017-08-07: 10 mL via INTRADERMAL
  Filled 2017-08-07: qty 10

## 2017-08-07 MED ORDER — PROMETHAZINE HCL 25 MG/ML IJ SOLN
INTRAMUSCULAR | Status: AC
  Administered 2017-08-07: 12.5 mg via INTRAVENOUS
  Filled 2017-08-07: qty 1

## 2017-08-07 MED ORDER — KETOROLAC TROMETHAMINE 30 MG/ML IJ SOLN
15.0000 mg | Freq: Once | INTRAMUSCULAR | Status: AC
  Administered 2017-08-07: 15 mg via INTRAVENOUS
  Filled 2017-08-07: qty 1

## 2017-08-07 NOTE — ED Provider Notes (Signed)
[facility] Emergency Department Provider Note  ___________________________________________   First MD Initiated Contact with Patient 08/07/17 0930     (approximate)  I have reviewed the triage vital signs and the nursing notes.   HISTORY  Chief Complaint Wound Check   HPI Cassandra Black is a 47 y.o. female who presents to the emergency department for evaluation and treatment of a abscess to her left buttock.  [facility] and they had advised her to come to the emergency department for possible I&D of the abscess as well as likely admission for uncontrolled diabetes.  Patient's symptoms have not changed and she does not feel any worse than she did yesterday, however she is continuing to vomit and has not been able to tolerate foods or fluids for the past 2 days.  She states that the urgent care center called in some prescriptions for her yesterday, but she was unable to go to the pharmacy to get them filled.  She states that she is the sole caregiver for her son who is wheelchair-bound and her daughter.  She states that she has not been compliant with her medications, although she does have them at home.  She denies fever.  The abscessed area has continued to drain some today, but is still very painful.  Patient states that she is only willing to stay until 2 PM this afternoon as she must get her children after school.  She states "I will leave the hospital by 2:00 PM if the papers are ready or not."  Past Medical History:  Diagnosis Date  . Acne   . Anemia   . Asthma   . Cataract   . Diabetic retinopathy of both eyes (HCC)   . Glaucoma   . HSV infection   . Hydradenitis   . Hypertension   . Uncontrolled diabetes mellitus [facility])     Patient Active Problem List   Diagnosis Date Noted  . Acute cystitis without hematuria 03/19/2017  . BV (bacterial vaginosis) 03/19/2017  . Diabetes mellitus type 2, uncomplicated (HCC)  02/24/2017  . Menorrhagia 02/09/2017  . Hypertension   . Herpes genitalis   . Glaucoma   . Uncontrolled diabetes mellitus (HCC)   . Asthma   . Anemia   . Hydradenitis   . 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 Mixed hyperlipidemia 07/17/2015  . Acute systolic CHF (congestive heart failure) (HCC) 07/17/2015  . 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 Diabetic macular edema (HCC) 08/31/2014    Past Surgical History:  Procedure Laterality Date  . BREAST CYST EXCISION Left    neg  . CESAREAN SECTION  1999/ 2010   Twins in 1999 (23 weeks)/ repeat CS in 2010  . EYE SURGERY     laser    Prior to Admission medications   Medication Sig Start Date End Date Taking? Authorizing Provider  [employer] WITH PUMP gel USE 1(ONE) APPLICATION(S) TOPICAL EVERY DAY 01/08/17   [provider]  Blood Glucose Monitoring Suppl DEVI 1 each by Does not apply route 4 (four) times daily -  before meals and at bedtime for 1 day. 08/07/17 08/08/17  Lynnwood Beckford, Kasandra Knudsen, FNP  clindamycin (CLEOCIN) 300 MG capsule Take 1 capsule (300 mg total) by mouth 3 (three) times daily for 10 days. 08/07/17 08/17/17  Christiano Blandon, Rulon Eisenmenger B, FNP  glucose blood test strip Use as instructed 08/07/17   Chinita Pester, FNP  insulin aspart (NOVOLOG) 100 UNIT/ML FlexPen Inject 22 Units into the skin 3 (three) times daily. 07/10/15   [provider]  Insulin Glargine (LANTUS SOLOSTAR) 100 UNIT/ML Solostar Pen Inject 45 Units into the skin Nightly. 07/10/15   [provider]  Lancets 28G MISC  02/02/14   [provider]  levonorgestrel (MIRENA) 20 MCG/24HR IUD 1 each by Intrauterine route once.    [provider]  lisinopril (PRINIVIL,ZESTRIL) 5 MG tablet Take 5 mg by mouth daily. 06/01/15 04/11/17  [provider]  metFORMIN (GLUCOPHAGE-XR) 500 MG 24 hr tablet Take 500 mg by mouth daily. 06/01/15 04/11/17  [provider]  ondansetron  (ZOFRAN ODT) 8 MG disintegrating tablet Take 1 tablet (8 mg total) by mouth 2 (two) times daily. 08/06/17   Lutricia Feil, PA-C  promethazine (PHENERGAN) 12.5 MG tablet Take 1 tablet (12.5 mg total) by mouth every 6 (six) hours as needed for nausea or vomiting. 08/07/17   Libni Fusaro, Rulon Eisenmenger B, FNP  sulfamethoxazole-trimethoprim (BACTRIM DS,SEPTRA DS) 800-160 MG tablet Take 1 tablet by mouth 2 (two) times daily. 08/06/17   Lutricia Feil, PA-C  triamterene-hydrochlorothiazide (MAXZIDE-25) 37.5-25 MG tablet Take 1 tablet by mouth daily.    [provider]  valACYclovir (VALTREX) 500 MG tablet Take 500 mg by mouth 2 (two) times daily.    [provider]    Allergies Patient has no known allergies.  Family History  Problem Relation Age of Onset  . COPD Father   . Diabetes Mother   . Cancer Mother 37       pancreatic  . Diabetes Sister   . Testicular cancer Son 8       Rhabdomyosarcoma  . Breast cancer Maternal Grandmother     Social History Social History   Tobacco Use  . Smoking status: Former Games developer  . Smokeless tobacco: Never Used  Substance Use Topics  . Alcohol use: Yes    Comment: occasionally  . Drug use: No    Review of Systems  Constitutional: No fever/chills Eyes: No visual changes. ENT: No sore throat. Cardiovascular: Denies chest pain. Respiratory: Denies shortness of breath. Gastrointestinal: No abdominal pain.  Positive for nausea and vomiting. no diarrhea.  No constipation. Genitourinary: Positive for dysuria. Musculoskeletal: Negative for back pain. Skin: Positive for abscess to the left buttock Neurological: Negative for headaches, focal weakness or numbness. ____________________________________________   PHYSICAL EXAM:  VITAL SIGNS: ED Triage Vitals  Enc Vitals Group     BP 08/07/17 0854 (!) 166/93     Pulse Rate 08/07/17 0854 (!) 101     Resp 08/07/17 0854 15     Temp 08/07/17 0854 97.9 F (36.6 C)     Temp Source 08/07/17 0854  Oral     SpO2 08/07/17 0854 99 %     Weight 08/07/17 0836 184 lb (83.5 kg)     Height --      Head Circumference --      Peak Flow --      Pain Score 08/07/17 1356 0     Pain Loc --      Pain Edu? --      Excl. in GC? --     Constitutional: Alert and oriented. Well appearing and in no acute distress. Eyes: Conjunctivae are normal. PERRL. EOMI. Head: Atraumatic. Nose: No congestion/rhinnorhea. Mouth/Throat: Mucous membranes are moist.  Oropharynx non-erythematous. Neck: No stridor.   Cardiovascular: Normal rate, regular rhythm. Grossly normal heart sounds.  Good peripheral circulation. Respiratory: Normal  respiratory effort.  No retractions. Lungs CTAB. Gastrointestinal: Soft and nontender. No distention. No abdominal bruits. No CVA tenderness. Musculoskeletal: No lower extremity tenderness nor edema.  No joint effusions. Neurologic:  Normal speech and language. No gross focal neurologic deficits are appreciated. No gait instability. Skin: Fluctuant area is present on the left upper, posterior thigh that extends toward but does not include the labia.  Area is indurated.  No induration crosses midline.  Serosanguineous drainage is noted at a small opening central to the lesion. Psychiatric: Mood and affect are normal. Speech and behavior are normal.  ____________________________________________   LABS (all labs ordered are listed, but only abnormal results are displayed)  Labs Reviewed  URINALYSIS, COMPLETE (UACMP) WITH MICROSCOPIC - Abnormal; Notable for the following components:      Result Value   Color, Urine YELLOW (*)    APPearance CLOUDY (*)    Glucose, UA >=500 (*)    Ketones, ur 5 (*)    Protein, ur >=300 (*)    Leukocytes, UA MODERATE (*)    Squamous Epithelial / LPF 0-5 (*)    All other components within normal limits  CBC WITH DIFFERENTIAL/PLATELET - Abnormal; Notable for the following components:   WBC 14.3 (*)    Hemoglobin 11.8 (*)    MCHC 31.8 (*)    Neutro  Abs 11.7 (*)    All other components within normal limits  COMPREHENSIVE METABOLIC PANEL - Abnormal; Notable for the following components:   Chloride 100 (*)    Glucose, Bld 388 (*)    Creatinine, Ser 1.23 (*)    Calcium 8.1 (*)    Albumin 2.2 (*)    AST 14 (*)    ALT 12 (*)    GFR calc non Af Amer 52 (*)    All other components within normal limits  GLUCOSE, CAPILLARY - Abnormal; Notable for the following components:   Glucose-Capillary 312 (*)    All other components within normal limits  GLUCOSE, CAPILLARY - Abnormal; Notable for the following components:   Glucose-Capillary 308 (*)    All other components within normal limits  URINE CULTURE  CULTURE, BLOOD (ROUTINE X 2)  CULTURE, BLOOD (ROUTINE X 2)  LACTIC ACID, PLASMA  LACTIC ACID, PLASMA  CBG MONITORING, ED   ____________________________________________  EKG  Not indicated ____________________________________________  RADIOLOGY  ED MD interpretation: Not indicated  Official radiology report(s): No results found.  ____________________________________________   PROCEDURES  Procedure(s) performed:   Marland Kitchen.Marland Kitchen.Incision and Drainage Date/Time: 08/07/2017 4:17 PM Performed by: Chinita Pesterriplett, Teletha Petrea B, FNP Authorized by: Chinita Pesterriplett, Jakara Blatter B, FNP   Consent:    Consent obtained:  Verbal   Consent given by:  Patient   Risks discussed:  Bleeding, infection, incomplete drainage and pain   Alternatives discussed:  Observation Location:    Type:  Abscess   Location: Posterior, medial left upper thigh. Anesthesia (see MAR for exact dosages):    Anesthesia method:  Local infiltration   Local anesthetic:  Lidocaine 1% w/o epi Procedure type:    Complexity:  Complex Procedure details:    Incision types:  Stab incision   Incision depth:  Dermal   Scalpel blade:  11   Wound management:  Probed and deloculated   Drainage:  Serosanguinous   Drainage amount:  Moderate   Wound treatment:  Drain placed   Packing materials:  1/4 in  iodoform gauze Post-procedure details:    Patient tolerance of procedure:  Tolerated well, no immediate complications    Critical Care  performed: No  ____________________________________________   INITIAL IMPRESSION / ASSESSMENT AND PLAN / ED COURSE  As part of my medical decision making, I reviewed the following data within the electronic MEDICAL RECORD NUMBER    47 year old female presenting to the emergency department for treatment and evaluation of multiple medical complaints.  Yesterday's chart was reviewed.  Patients lab studies seem to be improving, specifically her white blood cell count.  While here, she received 2 L of IV fluids which slowly brought her glucose down to 308.  She received Phenergan IV with significant reduction in nausea and was able to tolerate some clear liquids.  While here I&D was performed with mainly serosanguineous results.  Packing was placed in hopes to encourage any purulent drainage.  Clindamycin was given IV.  She will receive a prescription for the same.  She was also given a prescription for glucose monitor and strips as she states that she has not used her monitor for the past 2 years or so.  Patient was advised that unless she becomes compliant with her diabetic regimen she will likely experience recurrent and difficult to treat infections.  At this time, it appears that the patient is willing to comply with the recommended treatment and therapies.  She states that she does have insulin and all of her other medications available at home and they are in date.  Patient again states that she has to be out here about 2 PM.  She was encouraged to eat and take her insulin as prescribed.  She was also encouraged to take her nightly insulin.  She is to call today to schedule an appointment with her primary care provider to be seen on Monday.  She was advised that if she cannot be seen on Monday that she needs to come back to the emergency department for evaluation of the  abscess as well as recheck of the urine.  Blood cultures and urine cultures were requested and sent to the lab today.  Patient states that she has some family coming down from [place] to help her and she states that she will return to the emergency department this weekend if she begins to vomit again or develops a fever or other symptoms of concern.      ____________________________________________   FINAL CLINICAL IMPRESSION(S) / ED DIAGNOSES  Final diagnoses:  Acute cystitis without hematuria  Proteinuria, unspecified type  Abscess of buttock, left  Hyperglycemia  Noncompliance with medication regimen     ED Discharge Orders        Ordered    clindamycin (CLEOCIN) 300 MG capsule  3 times daily     08/07/17 1342    promethazine (PHENERGAN) 12.5 MG tablet  Every 6 hours PRN     08/07/17 1342    Blood Glucose Monitoring Suppl DEVI  3 times daily before meals & bedtime     08/07/17 1401    glucose blood test strip     08/07/17 1401       Note:  This document was prepared using Dragon voice recognition software and may include unintentional dictation errors.    Chinita Pester, FNP 08/07/17 1625    Governor Rooks, MD 08/09/17 [phone]

## 2017-08-07 NOTE — ED Triage Notes (Signed)
Pt here for wound check, abscess to buttocks.

## 2017-08-07 NOTE — Discharge Instructions (Signed)
Please see your doctor on Monday. Call today to request an appointment. Return to the ER this weekend if you are feeling worse. The packing in the abscess should be removed on Monday. If you are unable to see your primary care provider, return to the ER for packing removal and recheck.

## 2017-08-08 LAB — URINE CULTURE: Culture: >=100000 — AB

## 2017-08-11 ENCOUNTER — Other Ambulatory Visit: Payer: Self-pay

## 2017-08-11 ENCOUNTER — Ambulatory Visit
Admission: EM | Admit: 2017-08-11 | Discharge: 2017-08-11 | Disposition: A | Discharge status: Home / Self Care | Payer: Medicaid Other | Attending: Family Medicine | Admitting: Family Medicine

## 2017-08-11 DIAGNOSIS — L02416 Cutaneous abscess of left lower limb: Secondary | ICD-10-CM | POA: Diagnosis not present

## 2017-08-11 DIAGNOSIS — Z4800 Encounter for change or removal of nonsurgical wound dressing: Secondary | ICD-10-CM | POA: Diagnosis not present

## 2017-08-11 DIAGNOSIS — E8770 Fluid overload, unspecified: Secondary | ICD-10-CM | POA: Diagnosis not present

## 2017-08-11 DIAGNOSIS — Z4889 Encounter for other specified surgical aftercare: Secondary | ICD-10-CM

## 2017-08-11 DIAGNOSIS — Z09 Encounter for follow-up examination after completed treatment for conditions other than malignant neoplasm: Secondary | ICD-10-CM

## 2017-08-11 LAB — URINALYSIS, COMPLETE (UACMP) WITH MICROSCOPIC
Bacteria, UA: NONE SEEN
Bilirubin Urine: NEGATIVE
Glucose, UA: 500 mg/dL — AB
Leukocytes, UA: NEGATIVE
Nitrite: NEGATIVE
Protein, ur: >300 mg/dL — AB
Specific Gravity, Urine: 1.025 (ref 1.005–1.030)
pH: 6 (ref 5.0–8.0)

## 2017-08-11 LAB — BASIC METABOLIC PANEL
Anion gap: 8 (ref 5–15)
BUN: 36 mg/dL — ABNORMAL HIGH (ref 6–20)
CO2: 26 mmol/L (ref 22–32)
Calcium: 8 mg/dL — ABNORMAL LOW (ref 8.9–10.3)
Chloride: 100 mmol/L — ABNORMAL LOW (ref 101–111)
Creatinine, Ser: 1.08 mg/dL — ABNORMAL HIGH (ref 0.44–1.00)
GFR calc Af Amer: >60 mL/min (ref >60)
GFR calc non Af Amer: >60 mL/min (ref >60)
Glucose, Bld: 244 mg/dL — ABNORMAL HIGH (ref 65–99)
Potassium: 4 mmol/L (ref 3.5–5.1)
Sodium: 134 mmol/L — ABNORMAL LOW (ref 135–145)

## 2017-08-11 LAB — GLUCOSE, CAPILLARY: Glucose-Capillary: 211 mg/dL — ABNORMAL HIGH (ref 65–99)

## 2017-08-11 MED ORDER — FUROSEMIDE 20 MG PO TABS: 20.0000 mg | ORAL_TABLET | Freq: Every day | ORAL | 0 refills | Status: AC

## 2017-08-11 MED ORDER — MUPIROCIN 2 % EX OINT: 1.0000 "application " | TOPICAL_OINTMENT | Freq: Three times a day (TID) | CUTANEOUS | 0 refills | Status: AC

## 2017-08-11 MED ORDER — FLUCONAZOLE 150 MG PO TABS: ORAL_TABLET | ORAL | 0 refills | Status: AC

## 2017-08-11 NOTE — ED Provider Notes (Signed)
[facility]    CSN: [id number] Arrival date & time: 08/11/17  0849     History   Chief Complaint Chief Complaint  Patient presents with  . Wound Check    HPI Cassandra Black is a 47 y.o. female.   HPI  Cassandra Black returns today follow-up after she was seen in the emergency room at the after she was examined here.  Is taking antibiotics.  They had switched her over to the mycin.  Her glucose has been decreasing somewhat.  Her blood pressure remains high at 185/103.  Noreene LarssonJill has packing in her left thigh wound.  Made the appointment with Cassandra. White at the [facility] primary but is planning on doing that today.  Been using her insulin and her other medications.  Afebrile today.  Planing about the 3 weeks of fluid retention that she is having and was wondering if there is some way she can get added on decreasing it will follow up with Cassandra. White later on      Past Medical History:  Diagnosis Date  . Acne   . Anemia   . Asthma   . Cataract   . Diabetic retinopathy of both eyes (HCC)   . Glaucoma   . HSV infection   . Hydradenitis   . Hypertension   . Uncontrolled diabetes mellitus [facility](HCC)     Patient Active Problem List   Diagnosis Date Noted  . Acute cystitis without hematuria 03/19/2017  . BV (bacterial vaginosis) 03/19/2017  . Diabetes mellitus type 2, uncomplicated (HCC) 02/24/2017  . Menorrhagia 02/09/2017  . Hypertension   . Herpes genitalis   . Glaucoma   . Uncontrolled diabetes mellitus (HCC)   . Asthma   . Anemia   . Hydradenitis   . 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 Mixed hyperlipidemia 07/17/2015  . Acute systolic CHF (congestive heart failure) (HCC) 07/17/2015  . 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 Diabetic macular edema (HCC) 08/31/2014    Past Surgical History:  Procedure Laterality Date  . BREAST CYST EXCISION Left    neg  . CESAREAN SECTION  1999/ 2010   Twins in 1999 (23 weeks)/  repeat CS in 2010  . EYE SURGERY     laser    OB History    Gravida Para Term Preterm AB Living   3 2 1 1 1 2    SAB TAB Ectopic Multiple Live Births   1     1 2       Obstetric Comments   1st Menstrual Cycle:  15 1st Pregnancy:  26        Home Medications    Prior to Admission medications   Medication Sig Start Date End Date Taking? Authorizing Provider  [employer] WITH [employer]MP gel USE 1(ONE) APPLICATION(S) TOPICAL EVERY DAY 01/08/17   [provider]  clindamycin (CLEOCIN) 300 MG capsule Take 1 capsule (300 mg total) by mouth 3 (three) times daily for 10 days. 08/07/17 08/17/17  Triplett, Rulon Eisenmengerari B, FNP  fluconazole (DIFLUCAN) 150 MG tablet Take one tab for symptoms of yeast infection. Repeat x 1 in 72 hours. 08/11/17   Lutricia Feiloemer, Graysyn Bache P, PA-C  furosemide (LASIX) 20 MG tablet Take 1 tablet (20 mg total) by mouth daily. 08/11/17   Lutricia Feiloemer, Silva Aamodt P, PA-C  glucose blood test strip Use as instructed 08/07/17   Triplett, Cari B, FNP  insulin aspart (NOVOLOG) 100 UNIT/ML FlexPen Inject 22 Units into the skin 3 (  three) times daily. 07/10/15   [provider]  Insulin Glargine (LANTUS SOLOSTAR) 100 UNIT/ML Solostar Pen Inject 45 Units into the skin Nightly. 07/10/15   [provider]  Lancets 28G MISC  02/02/14   [provider]  levonorgestrel (MIRENA) 20 MCG/24HR IUD 1 each by Intrauterine route once.    [provider]  lisinopril (PRINIVIL,ZESTRIL) 5 MG tablet Take 5 mg by mouth daily. 06/01/15 04/11/17  [provider]  metFORMIN (GLUCOPHAGE-XR) 500 MG 24 hr tablet Take 500 mg by mouth daily. 06/01/15 04/11/17  [provider]  mupirocin ointment (BACTROBAN) 2 % Apply 1 application topically 3 (three) times daily. 08/11/17   Lutricia Feil, PA-C  ondansetron (ZOFRAN ODT) 8 MG disintegrating tablet Take 1 tablet (8 mg total) by mouth 2 (two) times daily. 08/06/17   Lutricia Feil, PA-C  promethazine (PHENERGAN) 12.5 MG tablet Take 1 tablet  (12.5 mg total) by mouth every 6 (six) hours as needed for nausea or vomiting. 08/07/17   Triplett, Rulon Eisenmenger B, FNP  sulfamethoxazole-trimethoprim (BACTRIM DS,SEPTRA DS) 800-160 MG tablet Take 1 tablet by mouth 2 (two) times daily. 08/06/17   Lutricia Feil, PA-C  triamterene-hydrochlorothiazide (MAXZIDE-25) 37.5-25 MG tablet Take 1 tablet by mouth daily.    [provider]  valACYclovir (VALTREX) 500 MG tablet Take 500 mg by mouth 2 (two) times daily.    [provider]    Family History Family History  Problem Relation Age of Onset  . COPD Father   . Diabetes Mother   . Cancer Mother 53       pancreatic  . Diabetes Sister   . Testicular cancer Son 8       Rhabdomyosarcoma  . Breast cancer Maternal Grandmother     Social History Social History   Tobacco Use  . Smoking status: Never Smoker  . Smokeless tobacco: Never Used  Substance Use Topics  . Alcohol use: Yes    Comment: occasionally  . Drug use: No    Comment: last use 2.5 years ago     Allergies   Patient has no known allergies.   Review of Systems Review of Systems  Constitutional: Positive for activity change. Negative for chills, fatigue and fever.  Skin: Positive for wound.  All other systems reviewed and are negative.    Physical Exam Triage Vital Signs ED Triage Vitals  Enc Vitals Group     BP 08/11/17 0911 (!) 185/103     Pulse Rate 08/11/17 0911 96     Resp 08/11/17 0911 20     Temp 08/11/17 0911 98.3 F (36.8 C)     Temp Source 08/11/17 0911 Oral     SpO2 08/11/17 0911 100 %     Weight 08/11/17 0913 218 lb 6 oz (99.1 kg)     Height 08/11/17 0913 5\' 5"  (1.651 m)     Head Circumference --      Peak Flow --      Pain Score 08/11/17 0913 0     Pain Loc --      Pain Edu? --      Excl. in GC? --    No data found.  Updated Vital Signs BP (!) 185/103 (BP Location: Left Arm)   Pulse 96   Temp 98.3 F (36.8 C) (Oral)   Resp 20   Ht 5\' 5"  (1.651 m)   Wt 218 lb 6 oz (99.1 kg)    LMP 07/25/2017   SpO2 100%  BMI 36.34 kg/m   Visual Acuity Right Eye Distance:   Left Eye Distance:   Bilateral Distance:    Right Eye Near:   Left Eye Near:    Bilateral Near:     Physical Exam  Constitutional: She is oriented to person, place, and time. She appears well-developed and well-nourished. No distress.  HENT:  Head: Normocephalic.  Eyes: Pupils are equal, round, and reactive to light. Right eye exhibits no discharge. Left eye exhibits no discharge.  Neck: Normal range of motion.  Pulmonary/Chest: Effort normal and breath sounds normal.  Musculoskeletal: Normal range of motion.  Neurological: She is alert and oriented to person, place, and time.  Skin: Skin is warm and dry. She is not diaphoretic.  Examination was performed with Herbert Seta, Geologist, engineering.  Upper medial thigh was examined today.  The area where the wound was draining so copiously has now healed over.  No drainage is seen although the patient states that one was placed in the emergency room.  Taking of the records did confirm this.  However none is present at this time.  The area does have softness and some induration towards the labia.  The abscess is adjacent to the anus but does not involve the anus.  Is much improved over the initial examination.  Psychiatric: She has a normal mood and affect. Her behavior is normal. Judgment and thought content normal.  Nursing note and vitals reviewed.    UC Treatments / Results  Labs (all labs ordered are listed, but only abnormal results are displayed) Labs Reviewed  GLUCOSE, CAPILLARY - Abnormal; Notable for the following components:      Result Value   Glucose-Capillary 211 (*)    All other components within normal limits  BASIC METABOLIC PANEL - Abnormal; Notable for the following components:   Sodium 134 (*)    Chloride 100 (*)    Glucose, Bld 244 (*)    BUN 36 (*)    Creatinine, Ser 1.08 (*)    Calcium 8.0 (*)    All other components  within normal limits  URINALYSIS, COMPLETE (UACMP) WITH MICROSCOPIC - Abnormal; Notable for the following components:   Glucose, UA 500 (*)    Hgb urine dipstick MODERATE (*)    Ketones, ur TRACE (*)    Protein, ur >300 (*)    Squamous Epithelial / LPF 6-30 (*)    All other components within normal limits  CBG MONITORING, ED    EKG  EKG Interpretation None       Radiology No results found.  Procedures Procedures (including critical care time)  Medications Ordered in UC Medications - No data to display   Initial Impression / Assessment and Plan / UC Course  I have reviewed the triage vital signs and the nursing notes.  Pertinent labs & imaging results that were available during my care of the patient were reviewed by me and considered in my medical decision making (see chart for details).     Plan: 1. Test/x-ray results and diagnosis reviewed with patient 2. rx as per orders; risks, benefits, potential side effects reviewed with patient 3. Recommend supportive treatment with warm compresses to the abscessed area.  Dry thoroughly and apply Bactroban ointment.  She will do this 3-4 times daily;this was discussed in detail with the patient.  Continue taking all your antibiotics until completion.  Starting you on a very low dose of Lasix for your swelling but this needs to be evaluated and treated further  by your primary care physician.  Need to make an appointment as soon as possible and allow them to assume her care. 4. F/u prn if symptoms worsen or don't improve   Final Clinical Impressions(s) / UC Diagnoses   Final diagnoses:  Encounter for recheck of abscess following incision and drainage    ED Discharge Orders        Ordered    fluconazole (DIFLUCAN) 150 MG tablet     08/11/17 1055    mupirocin ointment (BACTROBAN) 2 %  3 times daily     08/11/17 1055    furosemide (LASIX) 20 MG tablet  Daily     08/11/17 1055       Controlled Substance Prescriptions [place]  Controlled Substance Registry consulted? Not Applicable   Lutricia Feil, PA-C 08/11/17 1104

## 2017-08-11 NOTE — Discharge Instructions (Signed)
Use warm compresses 3 times daily for 10 minutes each time read dry thoroughly and apply Bactroban ointment to the area.  Elevate your legs as much as possible to help control swelling.  Nodule appointment with Cassandra Black as soon as possible for continuing follow-up.

## 2017-08-11 NOTE — ED Triage Notes (Signed)
Pt had I&D to left buttocks on Friday. Still taking ABX but told to f/u here.  3 weeks of fluid retention. Reports her blood pressure and her glucose has been running high. Wondering if she could go higher on her fluid pill. No PCP appointment scheduled.

## 2017-08-12 LAB — CULTURE, BLOOD (ROUTINE X 2)
Culture: NO GROWTH
Culture: NO GROWTH

## 2017-09-08 ENCOUNTER — Telehealth: Payer: Self-pay | Admitting: Obstetrics and Gynecology

## 2017-09-08 NOTE — Telephone Encounter (Signed)
MIRENA INSERT 4/22 AT 8:10 WITH SDJ

## 2017-09-08 NOTE — Telephone Encounter (Signed)
Noted. Will order to arrive by apt date/time. 

## 2017-09-21 ENCOUNTER — Encounter: Payer: Self-pay | Admitting: Obstetrics and Gynecology

## 2017-09-21 ENCOUNTER — Ambulatory Visit (INDEPENDENT_AMBULATORY_CARE_PROVIDER_SITE_OTHER): Payer: Medicaid Other | Admitting: Obstetrics and Gynecology

## 2017-09-21 ENCOUNTER — Telehealth: Payer: Self-pay

## 2017-09-21 VITALS — BP 122/74 | Ht 65.0 in | Wt 201.0 lb

## 2017-09-21 DIAGNOSIS — Z113 Encounter for screening for infections with a predominantly sexual mode of transmission: Secondary | ICD-10-CM

## 2017-09-21 DIAGNOSIS — N920 Excessive and frequent menstruation with regular cycle: Secondary | ICD-10-CM

## 2017-09-21 LAB — POCT WET PREP WITH KOH
CLUE CELLS WET PREP PER HPF POC: NEGATIVE
KOH PREP POC: NEGATIVE
PH, VAGINAL: 4.5
TRICHOMONAS UA: NEGATIVE

## 2017-09-21 NOTE — Telephone Encounter (Signed)
Pt was in the office today and was suppose to schedule for [facility] However the pt states she can only be seen on mondays. [facility] Pt stated she did not want to be seen on any other day only mondays and [facility] next Monday open would be the 10/12/17. Pt would like [facility] to contact her after the U/S instead of being seen.

## 2017-09-21 NOTE — Progress Notes (Signed)
Obstetrics & Gynecology Office Visit   Chief Complaint  Patient presents with  . Contraception   History of Present Illness: 47 y.o. [id number] female who presents with heavy bleeding despite having the Mirena IUD. She had the IUD placed in 06/2014. She initially had good control of her heavy bleeding.  More recently her bleeding has been getting heavier. She has been passing blood clots. She denies cramping.  She states that her bleeding is not as heavy as it was. However, it has become heavier.  She has had several major health issues since I have seen her last, including uncontrolled diabetes. She states that is under much better control.  She had multiple bouts of yeast infections and bacterial vaginosis. She would like to be tested for this today.  She wonders if the IUD is still in place or if its use is expiring.   Past Medical History:  Diagnosis Date  . Acne   . Anemia   . Asthma   . Cataract   . Diabetic retinopathy of both eyes (HCC)   . Glaucoma   . HSV infection   . Hydradenitis   . Hypertension   . Uncontrolled diabetes mellitus (HCC)     Past Surgical History:  Procedure Laterality Date  . BREAST CYST EXCISION Left    neg  . CESAREAN SECTION  1999/ 2010   Twins in 1999 (23 weeks)/ repeat CS in 2010  . EYE SURGERY     laser   Gynecologic History: Patient's last menstrual period was 09/07/2017.  Obstetric History: [id number]  Family History  Problem Relation Age of Onset  . COPD Father   . Diabetes Mother   . Cancer Mother 73       pancreatic  . Diabetes Sister   . Testicular cancer Son 8       Rhabdomyosarcoma  . Breast cancer Maternal Grandmother     Social History   Socioeconomic History  . Marital status: Divorced    Spouse name: Not on file  . Number of children: 2  . Years of education: Not on file  . Highest education level: Not on file  Occupational History  . Occupation: Armed forces logistics/support/administrative officer  Social Needs  . Financial resource strain:  Not on file  . Food insecurity:    Worry: Not on file    Inability: Not on file  . Transportation needs:    Medical: Not on file    Non-medical: Not on file  Tobacco Use  . Smoking status: Never Smoker  . Smokeless tobacco: Never Used  Substance and Sexual Activity  . Alcohol use: Yes    Comment: occasionally  . Drug use: No    Types: Marijuana    Comment: last use 2.5 years ago  . Sexual activity: Yes    Partners: Male    Birth control/protection: IUD  Lifestyle  . Physical activity:    Days per week: Not on file    Minutes per session: Not on file  . Stress: Not on file  Relationships  . Social connections:    Talks on phone: Not on file    Gets together: Not on file    Attends religious service: Not on file    Active member of club or organization: Not on file    Attends meetings of clubs or organizations: Not on file    Relationship status: Not on file  . Intimate partner violence:    Fear of current or ex partner: Not on  file    Emotionally abused: Not on file    Physically abused: Not on file    Forced sexual activity: Not on file  Other Topics Concern  . Not on file  Social History Narrative  . Not on file   Allergies: No Known Allergies  Prior to Admission medications   Medication Sig Start Date End Date Taking? Authorizing Provider  [employer] WITH PUMP gel USE 1(ONE) APPLICATION(S) TOPICAL EVERY DAY 01/08/17   [provider]  fluconazole (DIFLUCAN) 150 MG tablet Take one tab for symptoms of yeast infection. Repeat x 1 in 72 hours. 08/11/17   Lutricia Feiloemer, William P, PA-C  furosemide (LASIX) 20 MG tablet Take 1 tablet (20 mg total) by mouth daily. 08/11/17   Lutricia Feiloemer, William P, PA-C  glucose blood test strip Use as instructed 08/07/17   Triplett, Cari B, FNP  insulin aspart (NOVOLOG) 100 UNIT/ML FlexPen Inject 22 Units into the skin 3 (three) times daily. 07/10/15   [provider]  Insulin Glargine (LANTUS SOLOSTAR) 100 UNIT/ML Solostar Pen Inject 45  Units into the skin Nightly. 07/10/15   [provider]  Lancets 28G MISC  02/02/14   [provider]  levonorgestrel (MIRENA) 20 MCG/24HR IUD 1 each by Intrauterine route once.    [provider]  lisinopril (PRINIVIL,ZESTRIL) 5 MG tablet Take 5 mg by mouth daily. 06/01/15 04/11/17  [provider]  metFORMIN (GLUCOPHAGE-XR) 500 MG 24 hr tablet Take 500 mg by mouth daily. 06/01/15 04/11/17  [provider]  mupirocin ointment (BACTROBAN) 2 % Apply 1 application topically 3 (three) times daily. 08/11/17   Lutricia Feiloemer, William P, PA-C  ondansetron (ZOFRAN ODT) 8 MG disintegrating tablet Take 1 tablet (8 mg total) by mouth 2 (two) times daily. 08/06/17   Lutricia Feiloemer, William P, PA-C  promethazine (PHENERGAN) 12.5 MG tablet Take 1 tablet (12.5 mg total) by mouth every 6 (six) hours as needed for nausea or vomiting. 08/07/17   Triplett, Rulon Eisenmengerari B, FNP  sulfamethoxazole-trimethoprim (BACTRIM DS,SEPTRA DS) 800-160 MG tablet Take 1 tablet by mouth 2 (two) times daily. 08/06/17   Lutricia Feiloemer, William P, PA-C  triamterene-hydrochlorothiazide (MAXZIDE-25) 37.5-25 MG tablet Take 1 tablet by mouth daily.    [provider]  valACYclovir (VALTREX) 500 MG tablet Take 500 mg by mouth 2 (two) times daily.    [provider]   Review of Systems  Constitutional: Negative.   HENT: Negative.   Eyes: Negative.   Respiratory: Negative.   Cardiovascular: Negative.   Gastrointestinal: Negative.  Negative for abdominal pain, blood in stool, constipation, diarrhea, heartburn, melena, nausea and vomiting.  Genitourinary: Negative.   Musculoskeletal: Negative.   Skin: Negative.   Neurological: Negative.   Endo/Heme/Allergies: Negative.   Psychiatric/Behavioral: Negative.      Physical Exam BP 122/74   Ht 5\' 5"  (1.651 m)   Wt 201 lb (91.2 kg)   LMP 09/07/2017   BMI 33.45 kg/m  Patient's last menstrual period was 09/07/2017. Physical Exam  Constitutional: She is oriented to  person, place, and time. She appears well-developed and well-nourished. No distress.  Genitourinary: Vagina normal and uterus normal. Pelvic exam was performed with patient supine. There is no rash, tenderness or lesion on the right labia.  There is lesion (see image) on the left labia. There is no rash or tenderness on the left labia.    Vagina exhibits no lesion. No bleeding in the vagina. No foreign body in the vagina. No signs of injury around the vagina. No vaginal discharge  found. Right adnexum does not display mass, does not display tenderness and does not display fullness. Left adnexum does not display mass, does not display tenderness and does not display fullness.  Cervix exhibits visible IUD strings. Cervix does not exhibit motion tenderness, lesion, discharge, polyp or nabothian cyst.   Uterus is mobile.  HENT:  Head: Normocephalic and atraumatic.  Eyes: EOM are normal. No scleral icterus.  Neck: Normal range of motion. Neck supple.  Cardiovascular: Normal rate and regular rhythm.  Pulmonary/Chest: Effort normal and breath sounds normal. No respiratory distress. She has no wheezes. She has no rales.  Abdominal: Soft. Bowel sounds are normal. She exhibits no distension and no mass. There is no tenderness. There is no rebound and no guarding.  Musculoskeletal: Normal range of motion. She exhibits no edema.  Neurological: She is alert and oriented to person, place, and time. No cranial nerve deficit.  Skin: Skin is warm and dry. No erythema.  Psychiatric: She has a normal mood and affect. Her behavior is normal. Judgment normal.   Female chaperone present for pelvic and breast  portions of the physical exam  Wet Prep: PH: 4.5 Clue Cells: Negative Fungal elements: Negative Trichomonas: Negative (does not meet Amsel's criteria)  Assessment: 47 y.o. [id number] female here for  1. Menorrhagia with regular cycle   2. Screen for STD (sexually transmitted disease)       Plan: Problem List Items Addressed This Visit      Other   Menorrhagia - Primary   Relevant Orders   US PELVIS TRANSVANGINAL NON-OB (TV ONLY)   Chlamydia/Gonococcus/Trichomonas, NAA   POCT Wet Prep with KOH (Completed)    Other Visit Diagnoses    Screen for STD (sexually transmitted disease)       Relevant Orders   Chlamydia/Gonococcus/Trichomonas, NAA   POCT Wet Prep with KOH (Completed)     Discussed possible causes for increase in bleeding.  Her IUD could be out of place.  However, have ordered a pelvic ultrasound to assess structural issues and ensure the IUD is in the correct location.  STD screening and wet prep today.  Will discuss long-range plan based on findings from above.  15 minutes spent in face to face discussion with > 50% spent in counseling,management, and coordination of care of her menorrhagia with regular cycles.   Thomasene Mohair, MD 09/21/2017 5:06 PM   x

## 2017-09-25 LAB — CHLAMYDIA/GONOCOCCUS/TRICHOMONAS, NAA
CHLAMYDIA BY NAA: NEGATIVE
Gonococcus by NAA: NEGATIVE
Trich vag by NAA: NEGATIVE

## 2017-09-28 ENCOUNTER — Other Ambulatory Visit: Payer: Medicaid Other

## 2017-10-16 NOTE — Telephone Encounter (Signed)
I do not see a follow up for her. Would you mind calling her and having her schedule her ultrasound?  I can call her after the ultrasound, if that is what she prefers.

## 2017-10-16 NOTE — Telephone Encounter (Signed)
Pt states that she will call back soon to get this u/s rescheduled. Please schedule when she calls

## 2017-10-16 NOTE — Telephone Encounter (Signed)
Called pt and got disconnected. Please let me know when she calls back or schedule her U/S per SDJ and let me know. Thank you

## 2017-10-26 ENCOUNTER — Ambulatory Visit
Admission: EM | Admit: 2017-10-26 | Discharge: 2017-10-26 | Disposition: A | Discharge status: Home / Self Care | Payer: Medicaid Other | Attending: Family Medicine | Admitting: Family Medicine

## 2017-10-26 ENCOUNTER — Other Ambulatory Visit: Payer: Self-pay

## 2017-10-26 DIAGNOSIS — Z760 Encounter for issue of repeat prescription: Secondary | ICD-10-CM | POA: Diagnosis not present

## 2017-10-26 DIAGNOSIS — E1140 Type 2 diabetes mellitus with diabetic neuropathy, unspecified: Secondary | ICD-10-CM

## 2017-10-26 DIAGNOSIS — G629 Polyneuropathy, unspecified: Secondary | ICD-10-CM

## 2017-10-26 MED ORDER — TRAMADOL HCL 50 MG PO TABS: 50.0000 mg | ORAL_TABLET | Freq: Four times a day (QID) | ORAL | 0 refills | Status: AC | PRN

## 2017-10-26 NOTE — ED Triage Notes (Signed)
Pt reports she has nerve pain all over her body. She has been taking Gabapentin for the past two weeks which has helped but she ran out of it. She can't get a new prescription for Gabapentin until June 2nd and needs something for pain.

## 2017-10-26 NOTE — ED Provider Notes (Signed)
[facility]    CSN: [id number] Arrival date & time: 10/26/17  1542     History   Chief Complaint Chief Complaint  Patient presents with  . Medication Refill    HPI Cassandra Black is a 47 y.o. female.   47 yo female with a c/o pain to her lower extremities. States she has diabetic neuropathy and recently started on gabapentin by endocrinologist several weeks ago, however patient has run out and has refills but states since she doubled up on the dose she can't pick up her refill. Patient states she was prescribed  once daily which helped, then she doubled up to twice a day with better relief. States she'll be calling her endocrinologist office tomorrow to inform and request increase in gabapentin.   The history is provided by the patient.  Medication Refill    Past Medical History:  Diagnosis Date  . Acne   . Anemia   . Asthma   . Cataract   . Diabetic retinopathy of both eyes (HCC)   . Glaucoma   . HSV infection   . Hydradenitis   . Hypertension   . Uncontrolled diabetes mellitus [facility])     Patient Active Problem List   Diagnosis Date Noted  . Acute cystitis without hematuria 03/19/2017  . BV (bacterial vaginosis) 03/19/2017  . Diabetes mellitus type 2, uncomplicated (HCC) 02/24/2017  . Menorrhagia 02/09/2017  . Hypertension   . Herpes genitalis   . Glaucoma   . Uncontrolled diabetes mellitus (HCC)   . Asthma   . Anemia   . Hydradenitis   . 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 Mixed hyperlipidemia 07/17/2015  . Acute systolic CHF (congestive heart failure) (HCC) 07/17/2015  . 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 Diabetic macular edema (HCC) 08/31/2014    Past Surgical History:  Procedure Laterality Date  . BREAST CYST EXCISION Left    neg  . CESAREAN SECTION  1999/ 2010   Twins in 1999 (23 weeks)/ repeat CS in 2010  . EYE SURGERY     laser    OB History    Gravida  3   Para  2   Term  1   Preterm  1   AB  1   Living  2     SAB  1   TAB      Ectopic      Multiple  1   Live Births  2        Obstetric Comments  1st Menstrual Cycle:  15 1st Pregnancy:  26          Home Medications    Prior to Admission medications   Medication Sig Start Date End Date Taking? Authorizing Provider  [employer] WITH PUMP gel USE 1(ONE) APPLICATION(S) TOPICAL EVERY DAY 01/08/17   [provider]  fluconazole (DIFLUCAN) 150 MG tablet Take one tab for symptoms of yeast infection. Repeat x 1 in 72 hours. 08/11/17   Lutricia Feil, PA-C  furosemide (LASIX) 20 MG tablet Take 1 tablet (20 mg total) by mouth daily. 08/11/17   Lutricia Feil, PA-C  glucose blood test strip Use as instructed 08/07/17   Triplett, Cari B, FNP  insulin aspart (NOVOLOG) 100 UNIT/ML FlexPen Inject 22 Units into the skin 3 (three) times daily. 07/10/15   [provider]  Insulin Glargine (LANTUS SOLOSTAR) 100 UNIT/ML Solostar Pen Inject 45 Units into the skin Nightly. 07/10/15   [provider]  Lancets 28G MISC  02/02/14   [provider]  levonorgestrel (MIRENA) 20 MCG/24HR IUD 1 each by Intrauterine route once.    [provider]  lisinopril (PRINIVIL,ZESTRIL) 5 MG tablet Take 5 mg by mouth daily. 06/01/15 04/11/17  [provider]  metFORMIN (GLUCOPHAGE-XR) 500 MG 24 hr tablet Take 500 mg by mouth daily. 06/01/15 04/11/17  [provider]  mupirocin ointment (BACTROBAN) 2 % Apply 1 application topically 3 (three) times daily. 08/11/17   Lutricia Feil, PA-C  ondansetron (ZOFRAN ODT) 8 MG disintegrating tablet Take 1 tablet (8 mg total) by mouth 2 (two) times daily. 08/06/17   Lutricia Feil, PA-C  promethazine (PHENERGAN) 12.5 MG tablet Take 1 tablet (12.5 mg total) by mouth every 6 (six) hours as needed for nausea or vomiting. 08/07/17   Triplett, Rulon Eisenmenger B, FNP  sulfamethoxazole-trimethoprim (BACTRIM DS,SEPTRA DS) 800-160 MG  tablet Take 1 tablet by mouth 2 (two) times daily. 08/06/17   Lutricia Feil, PA-C  traMADol (ULTRAM) 50 MG tablet Take 1 tablet (50 mg total) by mouth every 6 (six) hours as needed. 10/26/17   Payton Mccallum, MD  triamterene-hydrochlorothiazide (MAXZIDE-25) 37.5-25 MG tablet Take 1 tablet by mouth daily.    [provider]  valACYclovir (VALTREX) 500 MG tablet Take 500 mg by mouth 2 (two) times daily.    [provider]    Family History Family History  Problem Relation Age of Onset  . COPD Father   . Diabetes Mother   . Cancer Mother 70       pancreatic  . Diabetes Sister   . Testicular cancer Son 8       Rhabdomyosarcoma  . Breast cancer Maternal Grandmother     Social History Social History   Tobacco Use  . Smoking status: Never Smoker  . Smokeless tobacco: Never Used  Substance Use Topics  . Alcohol use: Yes    Comment: occasionally  . Drug use: No    Comment: last use 2.5 years ago     Allergies   Tramadol   Review of Systems Review of Systems   Physical Exam Triage Vital Signs ED Triage Vitals  Enc Vitals Group     BP 10/26/17 1553 (!) 168/99     Pulse Rate 10/26/17 1553 83     Resp 10/26/17 1553 18     Temp 10/26/17 1553 98.6 F (37 C)     Temp Source 10/26/17 1553 Oral     SpO2 10/26/17 1553 100 %     Weight 10/26/17 1559 216 lb (98 kg)     Height 10/26/17 1559  (1.651 m)     Head Circumference --      Peak Flow --      Pain Score 10/26/17 1554 8     Pain Loc --      Pain Edu? --      Excl. in GC? --    No data found.  Updated Vital Signs BP (!) 168/99 (BP Location: Left Arm)   Pulse 83   Temp 98.6 F (37 C) (Oral)   Resp 18   Ht  (1.651 m)   Wt 216 lb (98 kg)   LMP 10/26/2017   SpO2 100%   BMI 35.94 kg/m   Visual Acuity Right Eye Distance:   Left Eye Distance:   Bilateral Distance:    Right Eye Near:   Left Eye Near:    Bilateral Near:  Physical Exam  Constitutional: She is oriented to  person, place, and time. She appears well-developed and well-nourished. No distress.  Musculoskeletal: She exhibits no edema, tenderness or deformity.  Neurological: She is alert and oriented to person, place, and time.  Skin: She is not diaphoretic.  Nursing note and vitals reviewed.    UC Treatments / Results  Labs (all labs ordered are listed, but only abnormal results are displayed) Labs Reviewed - No data to display  EKG None  Radiology No results found.  Procedures Procedures (including critical care time)  Medications Ordered in UC Medications - No data to display  Initial Impression / Assessment and Plan / UC Course  I have reviewed the triage vital signs and the nursing notes.  Pertinent labs & imaging results that were available during my care of the patient were reviewed by me and considered in my medical decision making (see chart for details).      Final Clinical Impressions(s) / UC Diagnoses   Final diagnoses:  Neuropathy   Discharge Instructions   None    ED Prescriptions    Medication Sig Dispense Auth. Provider   traMADol (ULTRAM) 50 MG tablet Take 1 tablet (50 mg total) by mouth every 6 (six) hours as needed. 15 tablet Payton Mccallum, MD     1. diagnosis reviewed with patient; rx for a few tramadol tablets given. Recommend follow up with endocrinologist and PCP for further management of her diabetic neuropathy.   Controlled Substance Prescriptions [place] Controlled Substance Registry consulted? Yes, I have consulted the [place] Controlled Substances Registry for this patient, and feel the risk/benefit ratio today is favorable for proceeding with this prescription for a controlled substance.   Payton Mccallum, MD 10/26/17 1736

## 2017-10-28 NOTE — Telephone Encounter (Signed)
Mirena not used.

## 2018-03-22 NOTE — Telephone Encounter (Signed)
To close
